# Patient Record
Sex: Male | Born: 1980 | Race: White | Hispanic: No | Marital: Married | Smoking: Current every day smoker
Health system: Southern US, Community
[De-identification: ages and names within clinical notes are randomized; demographics above are authoritative.]

## PROBLEM LIST (undated history)

## (undated) DIAGNOSIS — G44009 Cluster headache syndrome, unspecified, not intractable: Secondary | ICD-10-CM

## (undated) DIAGNOSIS — G43909 Migraine, unspecified, not intractable, without status migrainosus: Secondary | ICD-10-CM

## (undated) HISTORY — PX: WISDOM TOOTH EXTRACTION: SHX21

---

## 2009-01-06 ENCOUNTER — Ambulatory Visit: Payer: Self-pay | Admitting: Internal Medicine

## 2009-01-08 ENCOUNTER — Ambulatory Visit: Payer: Self-pay | Admitting: Internal Medicine

## 2009-01-10 ENCOUNTER — Ambulatory Visit: Payer: Self-pay | Admitting: Internal Medicine

## 2009-01-12 ENCOUNTER — Ambulatory Visit: Payer: Self-pay | Admitting: Internal Medicine

## 2009-01-16 ENCOUNTER — Ambulatory Visit: Payer: Self-pay | Admitting: Internal Medicine

## 2011-09-24 ENCOUNTER — Ambulatory Visit: Payer: Self-pay

## 2011-10-20 ENCOUNTER — Ambulatory Visit: Payer: Self-pay | Admitting: Family Medicine

## 2011-10-20 LAB — DOT URINE DIP
Glucose,UR: NEGATIVE mg/dL (ref 0–75)
Protein: NEGATIVE
Specific Gravity: 1.01 (ref 1.003–1.030)

## 2012-02-02 ENCOUNTER — Ambulatory Visit: Payer: Self-pay | Admitting: Internal Medicine

## 2012-02-04 LAB — BETA STREP CULTURE(ARMC)

## 2012-02-15 ENCOUNTER — Emergency Department: Payer: Self-pay | Admitting: Emergency Medicine

## 2012-02-19 LAB — WOUND CULTURE

## 2014-09-04 ENCOUNTER — Ambulatory Visit
Admit: 2014-09-04 | Disposition: A | Discharge status: Home / Self Care | Payer: Self-pay | Attending: Family Medicine | Admitting: Family Medicine

## 2014-09-05 ENCOUNTER — Emergency Department
Admit: 2014-09-05 | Disposition: A | Discharge status: Home / Self Care | Payer: Self-pay | Admitting: Emergency Medicine

## 2014-09-19 DIAGNOSIS — R51 Headache: Secondary | ICD-10-CM

## 2014-09-19 DIAGNOSIS — R519 Headache, unspecified: Secondary | ICD-10-CM | POA: Insufficient documentation

## 2014-09-19 DIAGNOSIS — G44011 Episodic cluster headache, intractable: Secondary | ICD-10-CM | POA: Insufficient documentation

## 2015-10-21 ENCOUNTER — Emergency Department: Payer: Self-pay

## 2015-10-21 ENCOUNTER — Emergency Department
Admission: EM | Admit: 2015-10-21 | Discharge: 2015-10-21 | Disposition: A | Discharge status: Home / Self Care | Payer: Self-pay | Attending: Emergency Medicine | Admitting: Emergency Medicine

## 2015-10-21 ENCOUNTER — Encounter: Payer: Self-pay | Admitting: Medical Oncology

## 2015-10-21 DIAGNOSIS — M5441 Lumbago with sciatica, right side: Secondary | ICD-10-CM | POA: Insufficient documentation

## 2015-10-21 DIAGNOSIS — G8929 Other chronic pain: Secondary | ICD-10-CM | POA: Insufficient documentation

## 2015-10-21 HISTORY — DX: Migraine, unspecified, not intractable, without status migrainosus: G43.909

## 2015-10-21 LAB — URINALYSIS COMPLETE WITH MICROSCOPIC (ARMC ONLY)
Bacteria, UA: NONE SEEN
Bilirubin Urine: NEGATIVE
Glucose, UA: NEGATIVE mg/dL
Hgb urine dipstick: NEGATIVE
Ketones, ur: NEGATIVE mg/dL
Leukocytes, UA: NEGATIVE
Nitrite: NEGATIVE
PROTEIN: NEGATIVE mg/dL
SQUAMOUS EPITHELIAL / LPF: NONE SEEN
Specific Gravity, Urine: 1.016 (ref 1.005–1.030)
pH: 6 (ref 5.0–8.0)

## 2015-10-21 MED ORDER — PREDNISONE 10 MG PO TABS: ORAL_TABLET | ORAL | Status: DC

## 2015-10-21 NOTE — ED Notes (Signed)
Pt reports lower back pain with radiation of pain into rt hip/leg x 1 week.

## 2015-10-21 NOTE — ED Notes (Signed)
Pt alert and oriented X4, active, cooperative, pt in NAD. RR even and unlabored, color WNL.  Pt informed to return if any life threatening symptoms occur.   

## 2015-10-21 NOTE — ED Provider Notes (Signed)
[facility] Emergency Department Provider Note   ____________________________________________  Time seen: Approximately 11:14 AM  I have reviewed the triage vital signs and the nursing notes.   HISTORY  Chief Complaint Back Pain   HPI Joe Guerra is a 35 y.o. male is here with complaint of back pain. Patient states that he has had back pain for "many years". He states that for the last 1-2 weeks that he has had increased back pain and has been doing a lot of lifting and bending at work which has probably made his back worse. Patient states that his pain radiating over into his right hip and leg last week. Patient states that he has not had his back x-rayed for approximately 8 years. He also denies any symptoms of UTIs and denies any previous history of kidney stones. He denies any incontinence of bowel or bladder. Currently he rates his pain as a 10 over 10. Patient also took a Percocet and wants someone else this morning for his back pain.   Past Medical History  Diagnosis Date  . Migraine     There are no active problems to display for this patient.   No past surgical history on file.  Current Outpatient Rx  Name  Route  Sig  Dispense  Refill  . predniSONE (DELTASONE) 10 MG tablet      Take 6 tablets  today, on day 2 take 5 tablets, day 3 take 4 tablets, day 4 take 3 tablets, day 5 take  2 tablets and 1 tablet the last day   21 tablet   0     Allergies Review of patient's allergies indicates no known allergies.  No family history on file.  Social History Social History  Substance Use Topics  . Smoking status: None  . Smokeless tobacco: None  . Alcohol Use: None    Review of Systems Constitutional: No fever/chills ENT: No sore throat. Cardiovascular: Denies chest pain. Respiratory: Denies shortness of breath. Gastrointestinal: No abdominal pain.  No nausea, no vomiting.   Genitourinary: Negative for  dysuria. Musculoskeletal: Positive for chronic and subacute low back pain. Skin: Negative for rash. Neurological: Negative for headaches, focal weakness or numbness. Positive radicular type pain over to right hip.  10-point ROS otherwise negative.  ____________________________________________   PHYSICAL EXAM:  VITAL SIGNS: ED Triage Vitals  Enc Vitals Group     BP 10/21/15 1045 106/74 mmHg     Pulse Rate 10/21/15 1045 75     Resp 10/21/15 1045 18     Temp 10/21/15 1045 97.7 F (36.5 C)     Temp Source 10/21/15 1045 Oral     SpO2 10/21/15 1045 99 %     Weight 10/21/15 1045 165 lb (74.844 kg)     Height 10/21/15 1045 6' (1.829 m)     Head Cir --      Peak Flow --      Pain Score 10/21/15 1046 10     Pain Loc --      Pain Edu? --      Excl. in GC? --     Constitutional: Alert and oriented. Well appearing and in no acute distress. Eyes: Conjunctivae are normal. PERRL. EOMI. Head: Atraumatic. Nose: No congestion/rhinnorhea. Neck: No stridor.   Cardiovascular: Normal rate, regular rhythm. Grossly normal heart sounds.  Good peripheral circulation. Respiratory: Normal respiratory effort.  No retractions. Lungs CTAB. Gastrointestinal: Soft and nontender. No distention.  Musculoskeletal: Examination of the lower back without  gross deformity. There is moderate tenderness on palpation of the L5-S1 paravertebral muscles to the right. No active muscle spasms are noted. Straight leg raises are approximately 90 with some discomfort in the right leg. Motor sensory function intact. Normal gait was noted. Neurologic:  Normal speech and language. No gross focal neurologic deficits are appreciated. No gait instability. Reflexes 1+ bilaterally. Skin:  Skin is warm, dry and intact. No rash noted. Psychiatric: Mood and affect are normal. Speech and behavior are normal.  ____________________________________________   LABS (all labs ordered are listed, but only abnormal results are  displayed)  Labs Reviewed  URINALYSIS COMPLETEWITH MICROSCOPIC ([facility] ONLY) - Abnormal; Notable for the following:    Color, Urine YELLOW (*)    APPearance CLEAR (*)    All other components within normal limits     RADIOLOGY  Lumbar spine x-ray per radiologist showed no acute abnormalities. ____________________________________________   PROCEDURES  Procedure(s) performed: None  Critical Care performed: No  ____________________________________________   INITIAL IMPRESSION / ASSESSMENT AND PLAN / ED COURSE  Pertinent labs & imaging results that were available during my care of the patient were reviewed by me and considered in my medical decision making (see chart for details).  Patient was given a prescription for prednisone 60 mg 6 day taper. Patient is follow-up with Dr. Joice LoftsPoggi if any continued problems with his back. Patient is also instructed to use ice or heat to his back as needed for pain and discomfort. ____________________________________________   FINAL CLINICAL IMPRESSION(S) / ED DIAGNOSES  Final diagnoses:  Chronic right-sided low back pain with right-sided sciatica      NEW MEDICATIONS STARTED DURING THIS VISIT:  Discharge Medication List as of 10/21/2015 12:19 PM    START taking these medications   Details  predniSONE (DELTASONE) 10 MG tablet Take 6 tablets  today, on day 2 take 5 tablets, day 3 take 4 tablets, day 4 take 3 tablets, day 5 take  2 tablets and 1 tablet the last day, Print         Note:  This document was prepared using Dragon voice recognition software and may include unintentional dictation errors.    Tommi Rumpshonda L Maridel Pixler, PA-C 10/21/15 1325

## 2015-10-21 NOTE — Discharge Instructions (Signed)
Begin taking prednisone 60 mg and taper for the next 6 days. Ice or heat to your lower back as needed for discomfort. Call and make an appointment with Dr. Joice LoftsPoggi for your back pain for further evaluation.

## 2015-10-21 NOTE — ED Notes (Signed)
States he has had lower back pain for several years off and on  But states pain has increased over the past 1-2 weeks  Denies any specific injury but does a lot of bending and lifting at work

## 2016-01-06 ENCOUNTER — Ambulatory Visit: Payer: Self-pay

## 2016-01-16 DIAGNOSIS — M545 Low back pain: Secondary | ICD-10-CM

## 2016-01-16 DIAGNOSIS — Z720 Tobacco use: Secondary | ICD-10-CM | POA: Insufficient documentation

## 2016-01-16 DIAGNOSIS — G8929 Other chronic pain: Secondary | ICD-10-CM | POA: Insufficient documentation

## 2016-01-31 ENCOUNTER — Ambulatory Visit: Payer: Self-pay | Attending: Neurology | Admitting: Physical Therapy

## 2016-01-31 DIAGNOSIS — G8929 Other chronic pain: Secondary | ICD-10-CM

## 2016-01-31 DIAGNOSIS — M5441 Lumbago with sciatica, right side: Secondary | ICD-10-CM | POA: Insufficient documentation

## 2016-01-31 DIAGNOSIS — R51 Headache: Secondary | ICD-10-CM | POA: Insufficient documentation

## 2016-01-31 DIAGNOSIS — M5442 Lumbago with sciatica, left side: Secondary | ICD-10-CM | POA: Insufficient documentation

## 2016-02-01 ENCOUNTER — Encounter: Payer: Self-pay | Admitting: Physical Therapy

## 2016-02-01 NOTE — Therapy (Addendum)
[facility] [address] [place], [place], [address] Phone: [phone]   Fax:  [phone]  Physical Therapy Evaluation  Patient Details  Name: Joe Guerra MRN: [id number] Date of Birth: 12/19/1980 No Data Recorded  Encounter Date: 01/31/2016      PT End of Session - 02/01/16 1504    Visit Number 1   Number of Visits 1   Date for PT Re-Evaluation 02/01/16   PT Start Time 0808   PT Stop Time 0939   PT Time Calculation (min) 91 min   Activity Tolerance Patient limited by pain      Past Medical History:  Diagnosis Date  . Migraine     History reviewed. No pertinent surgical history.  There were no vitals filed for this visit.       Subjective Assessment - 02/01/16 1504    Subjective See FCE   Currently in Pain? Yes      Overall Level of Work: Falls within the [employer] range.  Exerting up to 20 pounds of force occasionally, and/or up to 10 pounds of force frequently, and/or a negligible amount of force constantly (Constantly: activity or condition exist 2/3 or more of the time) to move objects.  Physical demand requirements are in excess of those for Sedentary Work.  Even though the weight lifted may be only a negligible amount, a job should be rated Light Work: (1) when it requires walking or standing to significant degree; or (2) when it requires sitting most of the time but entails pushing and/or pulling of arm or leg controls; and/or (3) when the job requires working at a production rate pace entailing the constant pushing and/or pulling of materials even though the weight of those materials is negligible.  NOTE: The constant stress and strain of maintaining a production rate pace, especially in an industrial setting, can be and is physically demanding of a worker even though the amount of force exerted is negligible.  Please note that the dynamic strength/manual materials handling section of the report indicates a  higher level of work than that determined by considering the client's performance on the entire test.  Our research shows that the safe, overall level of work is significantly influenced by non-materials handling (i.e. position tolerance and mobility) abilities.  To ignore these non-materials handling demands, negatively impacts the validity of the test.    Please see the Task Performance Table for specific abilities.   Tolerance for the 8-Hour Day: Based on the individual task scores in Dynamic Strength, Position Tolerance and Mobility, the client is able to tolerate the Light level of work for the 8-hour day/40-hour week.          Plan - 02/01/16 1505    Clinical Impression Statement See FCE   PT Frequency 1x / week   PT Treatment/Interventions Patient/family education;Functional mobility training      Patient will benefit from skilled therapeutic intervention in order to improve the following deficits and impairments:  Decreased activity tolerance, Pain  Visit Diagnosis: Bilateral low back pain with sciatica, sciatica laterality unspecified  Chronic nonintractable headache, unspecified headache type     Problem List There are no active problems to display for this patient.  Cammie McgeeMichael C Shawna Wearing, PT, DPT # [phone]  02/01/2016, 3:07 PM  [facility] [address] [place], [place], [address] Phone: [phone]   Fax:  [phone]  Name: Joe Guerra MRN: [id number] Date of Birth: 12/14/1980

## 2016-02-03 ENCOUNTER — Encounter: Payer: Self-pay | Admitting: Physical Therapy

## 2016-02-03 NOTE — Addendum Note (Signed)
Addended by: Cammie McgeeSHERK, Darly Fails C on: 02/03/2016 09:46 AM   Modules accepted: Orders

## 2016-06-26 ENCOUNTER — Encounter: Payer: Self-pay | Admitting: Emergency Medicine

## 2016-06-26 ENCOUNTER — Emergency Department: Payer: Self-pay

## 2016-06-26 ENCOUNTER — Emergency Department
Admission: EM | Admit: 2016-06-26 | Discharge: 2016-06-26 | Disposition: A | Discharge status: Home / Self Care | Payer: Self-pay | Attending: Emergency Medicine | Admitting: Emergency Medicine

## 2016-06-26 DIAGNOSIS — F17210 Nicotine dependence, cigarettes, uncomplicated: Secondary | ICD-10-CM | POA: Insufficient documentation

## 2016-06-26 DIAGNOSIS — N4340 Spermatocele of epididymis, unspecified: Secondary | ICD-10-CM | POA: Insufficient documentation

## 2016-06-26 DIAGNOSIS — N50819 Testicular pain, unspecified: Secondary | ICD-10-CM

## 2016-06-26 MED ORDER — TRAMADOL HCL 50 MG PO TABS: 50.0000 mg | ORAL_TABLET | Freq: Four times a day (QID) | ORAL | 0 refills | Status: AC | PRN

## 2016-06-26 NOTE — ED Provider Notes (Signed)
[facility] Emergency Department Provider Note   ____________________________________________   I have reviewed the triage vital signs and the nursing notes.   HISTORY  Chief Complaint Testicle Pain   History limited by: Not Limited   HPI Joe Guerra is a 36 y.o. male who presents to the emergency department today because of left testicular pain. The pain started last night. States it feels like something is thumping his testicle. Denies any trauma. States the pain is worse with touch. The patient has not had any pain or discomfort with urination. Denies any fevers, nausea or vomiting.    Past Medical History:  Diagnosis Date  . Migraine     There are no active problems to display for this patient.   History reviewed. No pertinent surgical history.  Prior to Admission medications   Medication Sig Start Date End Date Taking? Authorizing Provider  predniSONE (DELTASONE) 10 MG tablet Take 6 tablets  today, on day 2 take 5 tablets, day 3 take 4 tablets, day 4 take 3 tablets, day 5 take  2 tablets and 1 tablet the last day 10/21/15   Tommi Rumpshonda L Summers, PA-C    Allergies Patient has no known allergies.  No family history on file.  Social History Social History  Substance Use Topics  . Smoking status: Current Every Day Smoker    Packs/day: 0.50    Types: Cigarettes  . Smokeless tobacco: Never Used  . Alcohol use No    Review of Systems  Constitutional: Negative for fever. Cardiovascular: Negative for chest pain. Respiratory: Negative for shortness of breath. Gastrointestinal: Negative for abdominal pain, vomiting and diarrhea.  Genitourinary: Negative for dysuria. Positive for left testicle pain. Musculoskeletal: Negative for back pain. Skin: Negative for rash. Neurological: Negative for headaches, focal weakness or numbness.  10-point ROS otherwise negative.  ____________________________________________   PHYSICAL  EXAM:  VITAL SIGNS: ED Triage Vitals [06/26/16 1427]  Enc Vitals Group     BP (!) 139/92     Pulse Rate 93     Resp 18     Temp 97.7 F (36.5 C)     Temp Source Oral     SpO2 100 %     Weight 165 lb (74.8 kg)     Height 6' (1.829 m)     Head Circumference     Constitutional: Alert and oriented. Well appearing and in no distress. Eyes: Conjunctivae are normal. Normal extraocular movements. ENT   Head: Normocephalic and atraumatic.   Nose: No congestion/rhinnorhea.   Mouth/Throat: Mucous membranes are moist.   Neck: No stridor. Hematological/Lymphatic/Immunilogical: No cervical lymphadenopathy. Cardiovascular: Normal rate, regular rhythm.  No murmurs, rubs, or gallops.  Respiratory: Normal respiratory effort without tachypnea nor retractions. Breath sounds are clear and equal bilaterally. No wheezes/rales/rhonchi. Gastrointestinal: Soft and non tender. No rebound. No guarding.  Genitourinary: Bilateral descended testis. Left testicle with tenderness, no swelling, no erythema to overlying skin, no warmth.  Musculoskeletal: Normal range of motion in all extremities. No lower extremity edema. Neurologic:  Normal speech and language. No gross focal neurologic deficits are appreciated.  Skin:  Skin is warm, dry and intact. No rash noted. Psychiatric: Mood and affect are normal. Speech and behavior are normal. Patient exhibits appropriate insight and judgment.  ____________________________________________    LABS (pertinent positives/negatives)  None  ____________________________________________   EKG  None  ____________________________________________    RADIOLOGY  US scrotum IMPRESSION: 1. Large epididymal head cysts versus spermatoceles on the left, tender to palpation. 2.  No testicular abnormality.  ____________________________________________   PROCEDURES  Procedures  ____________________________________________   INITIAL IMPRESSION /  ASSESSMENT AND PLAN / ED COURSE  Pertinent labs & imaging results that were available during my care of the patient were reviewed by me and considered in my medical decision making (see chart for details).  Patient presented because of concern for left testicle pain. US shows epididymal head cysts versus spermatoceles. At this point think likely those are the cause of the patient's pain. Will give urology follow up information and pain medication.   ____________________________________________   FINAL CLINICAL IMPRESSION(S) / ED DIAGNOSES  Final diagnoses:  Testicular pain     Note: This dictation was prepared with Dragon dictation. Any transcriptional errors that result from this process are unintentional     Phineas Semen, MD 06/26/16 1609

## 2016-06-26 NOTE — ED Triage Notes (Signed)
Pt complains of pain to left testicle that started last night and states it is sore to touch, hurts when walking or bending.

## 2016-06-26 NOTE — Discharge Instructions (Signed)
Please seek medical attention for any high fevers, chest pain, shortness of breath, change in behavior, persistent vomiting, bloody stool or any other new or concerning symptoms.  

## 2016-07-13 ENCOUNTER — Encounter: Payer: Self-pay | Admitting: Urology

## 2016-07-13 ENCOUNTER — Ambulatory Visit (INDEPENDENT_AMBULATORY_CARE_PROVIDER_SITE_OTHER): Payer: Self-pay | Admitting: Urology

## 2016-07-13 VITALS — BP 153/88 | HR 93 | Ht 72.0 in | Wt 172.1 lb

## 2016-07-13 DIAGNOSIS — R3129 Other microscopic hematuria: Secondary | ICD-10-CM

## 2016-07-13 DIAGNOSIS — N4340 Spermatocele of epididymis, unspecified: Secondary | ICD-10-CM

## 2016-07-13 DIAGNOSIS — N50819 Testicular pain, unspecified: Secondary | ICD-10-CM

## 2016-07-13 DIAGNOSIS — R399 Unspecified symptoms and signs involving the genitourinary system: Secondary | ICD-10-CM

## 2016-07-13 LAB — URINALYSIS, COMPLETE
BILIRUBIN UA: NEGATIVE
Glucose, UA: NEGATIVE
KETONES UA: NEGATIVE
Leukocytes, UA: NEGATIVE
NITRITE UA: NEGATIVE
Protein, UA: NEGATIVE
SPEC GRAV UA: 1.02 (ref 1.005–1.030)
UUROB: 0.2 mg/dL (ref 0.2–1.0)
pH, UA: 5.5 (ref 5.0–7.5)

## 2016-07-13 LAB — MICROSCOPIC EXAMINATION: Epithelial Cells (non renal): NONE SEEN /hpf (ref 0–10)

## 2016-07-13 LAB — BLADDER SCAN AMB NON-IMAGING: SCAN RESULT: 20

## 2016-07-13 MED ORDER — IBUPROFEN 400 MG PO TABS: 400.0000 mg | ORAL_TABLET | Freq: Four times a day (QID) | ORAL | 0 refills | Status: DC | PRN

## 2016-07-13 NOTE — Progress Notes (Signed)
07/13/2016 12:49 PM   Joe Guerra 10-Feb-1981 [id number]  Referring provider: Morene Crocker, MD [phone] [facility] [address] [facility] [place], [place] [address]  Chief Complaint  Patient presents with  . Testicle Pain    HPI: Patient is a 36 year old Caucasian male who is referred to [place] by [facility] ED for left testicular pain who presents with his mother, Gunnar Fusi.    He states that the pain started two weeks ago.  It started in the evening while he was cooking and became sore.  The pain increased throughout the evening and it became unbearable.  It was 10/10 pain.  He did not have fevers, chills, nausea or vomiting.  He did have a weak urinary stream with the pain.  He did not notice any difference with his testicles with the pain.  He has not had gross hematuria, dysuria or suprapubic pain.   He denies any injuries to the scrotal area.  He was also experiencing frequency, urgency and intermittency.   I PSS score is 20/4.  His PVR 20 mL.    He is sexually active with his wife only.    He was seen in ED where the scrotal ultrasound was performed and he was given Tramadol for pain.    The pain has improved and he was going to cancel the appointment, but then the pain changed from left testicular pain radiating up to the left groin over to the right testicle.  The pain is now a 3/10.    Scrotal ultrasound performed on 06/26/2016 noted a large epididymal head cysts versus spermatoceles on the left, tender to palpation.  No testicular abnormality.  I have independently reviewed the films.    He has also been experiencing premature ejaculation for the last several months.    He has a history of right sciatic for which he sees the chiropractor when he can afford it.       IPSS    Row Name 07/13/16 1100         International Prostate Symptom Score   How often have you had the sensation of not emptying your bladder? Not at All     How often have you had to  urinate less than every two hours? About half the time     How often have you found you stopped and started again several times when you urinated? Almost always     How often have you found it difficult to postpone urination? More than half the time     How often have you had a weak urinary stream? Almost always     How often have you had to strain to start urination? About half the time     How many times did you typically get up at night to urinate? None     Total IPSS Score 20       Quality of Life due to urinary symptoms   If you were to spend the rest of your life with your urinary condition just the way it is now how would you feel about that? Mostly Disatisfied        Score:  1-7 Mild 8-19 Moderate 20-35 Severe  PMH: Past Medical History:  Diagnosis Date  . Migraine     Surgical History: Past Surgical History:  Procedure Laterality Date  . WISDOM TOOTH EXTRACTION      Home Medications:  Allergies as of 07/13/2016   No Known Allergies  Medication List       Accurate as of 07/13/16 12:49 PM. Always use your most recent med list.          ibuprofen 400 MG tablet Commonly known as:  ADVIL,MOTRIN Take 1 tablet (400 mg total) by mouth every 6 (six) hours as needed.   nortriptyline 50 MG capsule Commonly known as:  PAMELOR Take by mouth.   predniSONE 10 MG tablet Commonly known as:  DELTASONE Take 6 tablets  today, on day 2 take 5 tablets, day 3 take 4 tablets, day 4 take 3 tablets, day 5 take  2 tablets and 1 tablet the last day   topiramate 100 MG tablet Commonly known as:  TOPAMAX Take by mouth.   traMADol 50 MG tablet Commonly known as:  ULTRAM Take 1 tablet (50 mg total) by mouth every 6 (six) hours as needed.   verapamil 80 MG tablet Commonly known as:  CALAN Take by mouth.       Allergies: No Known Allergies  Family History: Family History  Problem Relation Age of Onset  . Heart disease Father   . Thyroid cancer Mother   . Prostate  cancer Paternal Grandfather   . Bladder Cancer Neg Hx   . Kidney cancer Neg Hx     Social History:  reports that he has been smoking Cigarettes.  He has been smoking about 0.50 packs per day. He has never used smokeless tobacco. He reports that he does not drink alcohol or use drugs.  ROS: UROLOGY Frequent Urination?: Yes Hard to postpone urination?: Yes Burning/pain with urination?: No Get up at night to urinate?: No Leakage of urine?: Yes Urine stream starts and stops?: Yes Trouble starting stream?: No Do you have to strain to urinate?: No Blood in urine?: No Urinary tract infection?: No Sexually transmitted disease?: No Injury to kidneys or bladder?: No Painful intercourse?: No Weak stream?: Yes Erection problems?: No Penile pain?: No  Gastrointestinal Nausea?: No Vomiting?: No Indigestion/heartburn?: Yes Diarrhea?: No Constipation?: Yes  Constitutional Fever: No Night sweats?: Yes Weight loss?: No Fatigue?: No  Skin Skin rash/lesions?: No Itching?: No  Eyes Blurred vision?: No Double vision?: No  Ears/Nose/Throat Sore throat?: No Sinus problems?: Yes  Hematologic/Lymphatic Swollen glands?: No Easy bruising?: No  Cardiovascular Leg swelling?: No Chest pain?: No  Respiratory Cough?: No Shortness of breath?: No  Endocrine Excessive thirst?: No  Musculoskeletal Back pain?: Yes Joint pain?: Yes  Neurological Headaches?: Yes Dizziness?: No  Psychologic Depression?: Yes Anxiety?: Yes  Physical Exam: BP (!) 153/88 (BP Location: Left Arm, Patient Position: Sitting, Cuff Size: Normal)   Pulse 93   Ht 6' (1.829 m)   Wt 172 lb 1.6 oz (78.1 kg)   BMI 23.34 kg/m   Constitutional: Well nourished. Alert and oriented, No acute distress. HEENT: [place] AT, moist mucus membranes. Trachea midline, no masses. Cardiovascular: No clubbing, cyanosis, or edema. Respiratory: Normal respiratory effort, no increased work of breathing. GI: Abdomen is soft,  non tender, non distended, no abdominal masses. Liver and spleen not palpable.  No hernias appreciated.  Stool sample for occult testing is not indicated.   GU: No CVA tenderness.  No bladder fullness or masses.  Patient with circumcised phallus.  Urethral meatus is patent.  No penile discharge. No penile lesions or rashes. Scrotum without lesions, cysts, rashes and/or edema.  Testicles are located scrotally bilaterally. No masses are appreciated in the testicles. Left and right epididymis are normal.  Left spermatocele is tender to palpation.  Rectal: Patient with increased sphincter tone. Anus and perineum without scarring or rashes. No rectal masses are appreciated. Prostate is approximately 45 grams, no nodules are appreciated. Seminal vesicles are normal. Skin: No rashes, bruises or suspicious lesions. Lymph: No cervical or inguinal adenopathy. Neurologic: Grossly intact, no focal deficits, moving all 4 extremities. Psychiatric: Normal mood and affect.  Laboratory Data: Urinalysis 0-10 RBC's.  See EPIC.    Pertinent Imaging: CLINICAL DATA:  Testicular pain  EXAM: SCROTAL ULTRASOUND  DOPPLER ULTRASOUND OF THE TESTICLES  TECHNIQUE: Complete ultrasound examination of the testicles, epididymis, and other scrotal structures was performed. Color and spectral Doppler ultrasound were also utilized to evaluate blood flow to the testicles.  COMPARISON:  None.  FINDINGS: Right testicle  Measurements: 5.4 x 2.3 x 2.9 cm. No mass or microlithiasis visualized.  Left testicle  Measurements: 5.5 x 2.0 x 3.3 cm. No mass or microlithiasis visualized.  Right epididymis:  Normal in size and appearance.  Left epididymis: There are 2 large hypoechoic structures at the left epididymal head. The first measures approximately 2.2 x 1.2 x 2.3 cm. The second measures 1.2 x 0.9 x 1.3 cm and contains mildly echogenic debris.  Hydrocele:  Small bilateral hydroceles.  Varicocele:   None.  Pulsed Doppler interrogation of both testes demonstrates normal low resistance arterial and venous waveforms bilaterally.  IMPRESSION: 1. Large epididymal head cysts versus spermatoceles on the left, tender to palpation. 2. No testicular abnormality.   Electronically Signed   By: Deatra Robinson M.D.   On: 06/26/2016 15:35  Results for Morua, JONES VIVIANI (MRN [id number]) as of 07/13/2016 11:14  Ref. Range 07/13/2016 10:43  Scan Result Unknown 20    Assessment & Plan:    1. Left spermatocele  - a trial of NSAIDS for 6 weeks  - patient is instructed to contact the office if pain worsens or testicles/scrotum swells  - Advised to contact our office or seek treatment in the ED if becomes febrile or pain/ vomiting are difficult control in order to arrange for emergent/urgent intervention  2. LU TS  - I PSS score 20/4  - sphincter tone was increased on exam - pelvic floor dysfunction may be the cause of his urinary symptoms - patient without insurance so unable to afford PT  - will reassess once hematuria work up is completed  3. Microscopic hematuria  - I explained to the patient that there are a number of causes that can be associated with blood in the urine, such as stones,  BPH, UTI's, damage to the urinary tract and/or cancer.  - At this time, I felt that the patient warranted further urologic evaluation.   The AUA guidelines state that a CT urogram is the preferred imaging study to evaluate hematuria.  - I explained to the patient that a contrast material will be injected into a vein and that in rare instances, an allergic reaction can result and may even life threatening   The patient denies any allergies to contrast, iodine and/or seafood and is not taking metformin.  - Following the imaging study,  I've recommended a cystoscopy. I described how this is performed, typically in an office setting with a flexible cystoscope. We described the risks, benefits, and  possible side effects, the most common of which is a minor amount of blood in the urine and/or burning which usually resolves in 24 to 48 hours.    - The patient had the opportunity to ask questions which were answered. Based upon this discussion,  the patient is willing to proceed. Therefore, I've ordered: a CT Urogram and cystoscopy.  - The patient will return following all of the above for discussion of the results.   - UA  - Urine culture  - BUN + creatinine     Return for CT Urogram report and cystoscopy.  These notes generated with voice recognition software. I apologize for typographical errors.  Michiel Cowboy, PA-C  [facility] [address] [place], [place] [address] [phone]

## 2016-07-14 LAB — BUN+CREAT
BUN / CREAT RATIO: 15 (ref 9–20)
BUN: 14 mg/dL (ref 6–20)
CREATININE: 0.95 mg/dL (ref 0.76–1.27)
GFR calc Af Amer: 118 mL/min/{1.73_m2} (ref >59)
GFR, EST NON AFRICAN AMERICAN: 102 mL/min/{1.73_m2} (ref >59)

## 2016-07-16 LAB — CULTURE, URINE COMPREHENSIVE

## 2016-07-24 ENCOUNTER — Ambulatory Visit
Admission: RE | Admit: 2016-07-24 | Discharge: 2016-07-24 | Disposition: A | Discharge status: Home / Self Care | Payer: Self-pay | Source: Ambulatory Visit | Attending: Urology | Admitting: Urology

## 2016-07-24 DIAGNOSIS — R3129 Other microscopic hematuria: Secondary | ICD-10-CM | POA: Insufficient documentation

## 2016-07-24 MED ORDER — IOPAMIDOL (ISOVUE-300) INJECTION 61%
150.0000 mL | Freq: Once | INTRAVENOUS | Status: AC | PRN
  Administered 2016-07-24: 150 mL via INTRAVENOUS

## 2016-07-30 ENCOUNTER — Encounter: Payer: Self-pay | Admitting: Urology

## 2016-07-30 ENCOUNTER — Ambulatory Visit: Payer: Self-pay | Admitting: Urology

## 2016-07-30 VITALS — BP 155/90 | HR 116 | Ht 72.0 in | Wt 181.3 lb

## 2016-07-30 DIAGNOSIS — R3129 Other microscopic hematuria: Secondary | ICD-10-CM

## 2016-07-30 LAB — MICROSCOPIC EXAMINATION
Bacteria, UA: NONE SEEN
RBC MICROSCOPIC, UA: NONE SEEN /HPF (ref 0–?)
WBC, UA: NONE SEEN /hpf (ref 0–?)

## 2016-07-30 LAB — URINALYSIS, COMPLETE
Bilirubin, UA: NEGATIVE
GLUCOSE, UA: NEGATIVE
KETONES UA: NEGATIVE
Leukocytes, UA: NEGATIVE
NITRITE UA: NEGATIVE
PROTEIN UA: NEGATIVE
RBC, UA: NEGATIVE
SPEC GRAV UA: 1.01 (ref 1.005–1.030)
Urobilinogen, Ur: 0.2 mg/dL (ref 0.2–1.0)
pH, UA: 5.5 (ref 5.0–7.5)

## 2016-07-30 MED ORDER — LIDOCAINE HCL 2 % EX GEL
1.0000 "application " | Freq: Once | CUTANEOUS | Status: AC
  Administered 2016-07-30: 1 via URETHRAL

## 2016-07-30 MED ORDER — CIPROFLOXACIN HCL 500 MG PO TABS
500.0000 mg | ORAL_TABLET | Freq: Once | ORAL | Status: AC
  Administered 2016-07-30: 500 mg via ORAL

## 2016-07-30 NOTE — Progress Notes (Signed)
   07/30/16  CC:  Chief Complaint  Patient presents with  . Cysto    hematuria     HPI: The patient is a 36 year old gentleman presents today for completion of a microscopic hematuria workup. CT urogram was unremarkable for sources of hematuria. Patient does smoke.  At his last visit, he is also diagnosed with a spermatocele as well as lower urinary tract symptoms likely secondary to increased sphincter tone as assessed on physical exam. His urinary symptoms have resolved. He also had testicular pain which is no longer present.  There were no vitals taken for this visit. NED. A&Ox3.   No respiratory distress   Abd soft, NT, ND Normal phallus with bilateral descended testicles  Cystoscopy Procedure Note  Patient identification was confirmed, informed consent was obtained, and patient was prepped using Betadine solution.  Lidocaine jelly was administered per urethral meatus.    Preoperative abx where received prior to procedure.     Pre-Procedure: - Inspection reveals a normal caliber ureteral meatus.  Procedure: The flexible cystoscope was introduced without difficulty - No urethral strictures/lesions are present. - Normal prostate  - Normal bladder neck - Bilateral ureteral orifices identified - Bladder mucosa  reveals no ulcers, tumors, or lesions - No bladder stones - No trabeculation  Retroflexion shows no intravesical lobe   Post-Procedure: - Patient tolerated the procedure well  Assessment/ Plan:  1. Microscopic hematuria -Negative work up. Repeat urinalysis in one year

## 2017-05-19 ENCOUNTER — Telehealth: Payer: Self-pay | Admitting: Pharmacy Technician

## 2017-05-19 NOTE — Telephone Encounter (Signed)
Patient failed to provide current poi.  No additional medication assistance will be provided by [facility] without the required proof of income documentation.  Patient notified by letter.  Edwin Baines J. Tramaine Snell Care Manager [facility] 

## 2017-07-29 ENCOUNTER — Ambulatory Visit: Payer: Self-pay

## 2018-05-14 ENCOUNTER — Ambulatory Visit
Admission: EM | Admit: 2018-05-14 | Discharge: 2018-05-14 | Disposition: A | Discharge status: Home / Self Care | Payer: BLUE CROSS/BLUE SHIELD | Attending: Family Medicine | Admitting: Family Medicine

## 2018-05-14 ENCOUNTER — Other Ambulatory Visit: Payer: Self-pay

## 2018-05-14 DIAGNOSIS — G44019 Episodic cluster headache, not intractable: Secondary | ICD-10-CM | POA: Diagnosis not present

## 2018-05-14 MED ORDER — ONDANSETRON 4 MG PO TBDP: 4.0000 mg | ORAL_TABLET | Freq: Three times a day (TID) | ORAL | 0 refills | Status: DC | PRN

## 2018-05-14 MED ORDER — SUMATRIPTAN SUCCINATE 100 MG PO TABS: ORAL_TABLET | ORAL | 1 refills | Status: DC

## 2018-05-14 MED ORDER — SUMATRIPTAN SUCCINATE 6 MG/0.5ML ~~LOC~~ SOLN
6.0000 mg | Freq: Once | SUBCUTANEOUS | Status: AC
  Administered 2018-05-14: 6 mg via SUBCUTANEOUS

## 2018-05-14 NOTE — ED Provider Notes (Signed)
[facility]    CSN: [id number] Arrival date & time: 05/14/18  1441  History   Chief Complaint Chief Complaint  Patient presents with  . Headache   HPI  37 year old male with history of cluster headaches presents with frequent headache.  Patient previously followed by neurology and notes with episodic cluster headaches.  Patient states that he not been on any medication for quite some time.  He states that he could not afford it.  Patient states that for the past 3 weeks he has had daily headaches.  Come in bursts.  Severe.  He reports associated photophobia and phonophobia.  Nausea as well.  He states that he is having a mild headache currently.  He has had a beneficial response to Imitrex in the past.  No reports of fever.  No other associated symptoms.  No other complaints.  PMH, Surgical Hx, Family Hx, Social History reviewed and updated as below.  PMH: Patient Active Problem List   Diagnosis Date Noted  . Chronic bilateral low back pain without sciatica 01/16/2016  . Tobacco abuse 01/16/2016  . Headache 09/19/2014  . Intractable episodic cluster headache 09/19/2014   Past Surgical History:  Procedure Laterality Date  . WISDOM TOOTH EXTRACTION     Home Medications    Prior to Admission medications   Medication Sig Start Date End Date Taking? Authorizing Provider  ondansetron (ZOFRAN-ODT) 4 MG disintegrating tablet Take 1 tablet (4 mg total) by mouth every 8 (eight) hours as needed for nausea or vomiting. 05/14/18   Tommie Samsook, Alayssa Flinchum G, DO  SUMAtriptan (IMITREX) 100 MG tablet 1 tablet at the onset of headache. May repeat dose in 2 hours. 05/14/18   Tommie Samsook, Berdena Cisek G, DO    Family History Family History  Problem Relation Age of Onset  . Heart disease Father   . Thyroid cancer Mother   . Prostate cancer Paternal Grandfather   . Bladder Cancer Neg Hx   . Kidney cancer Neg Hx     Social History Social History   Tobacco Use  . Smoking status: Current Every Day  Smoker    Packs/day: 0.50    Types: Cigarettes  . Smokeless tobacco: Never Used  Substance Use Topics  . Alcohol use: No  . Drug use: No     Allergies   Influenza vaccines   Review of Systems Review of Systems  Constitutional: Negative for fever.  Eyes: Positive for photophobia.  Gastrointestinal: Positive for nausea.  Neurological: Positive for headaches.   Physical Exam Triage Vital Signs ED Triage Vitals  Enc Vitals Group     BP 05/14/18 1510 (!) 139/94     Pulse Rate 05/14/18 1510 66     Resp 05/14/18 1510 16     Temp 05/14/18 1510 97.6 F (36.4 C)     Temp Source 05/14/18 1510 Oral     SpO2 05/14/18 1510 100 %     Weight 05/14/18 1511 180 lb (81.6 kg)     Height 05/14/18 1511 6' (1.829 m)     Head Circumference --      Peak Flow --      Pain Score 05/14/18 1511 10     Pain Loc --      Pain Edu? --      Excl. in GC? --    Updated Vital Signs BP (!) 139/94 (BP Location: Left Arm)   Pulse 66   Temp 97.6 F (36.4 C) (Oral)   Resp 16   Ht 6' (1.829  m)   Wt 81.6 kg   SpO2 100%   BMI 24.41 kg/m   Visual Acuity Right Eye Distance:   Left Eye Distance:   Bilateral Distance:    Right Eye Near:   Left Eye Near:    Bilateral Near:     Physical Exam Vitals signs and nursing note reviewed.  Constitutional:      General: He is not in acute distress. HENT:     Head: Normocephalic and atraumatic.  Eyes:     Conjunctiva/sclera: Conjunctivae normal.     Pupils: Pupils are equal, round, and reactive to light.  Cardiovascular:     Rate and Rhythm: Normal rate and regular rhythm.  Pulmonary:     Effort: Pulmonary effort is normal. No respiratory distress.     Breath sounds: Normal breath sounds.  Neurological:     General: No focal deficit present.     Mental Status: He is alert and oriented to person, place, and time.  Psychiatric:        Mood and Affect: Mood normal.        Behavior: Behavior normal.    UC Treatments / Results  Labs (all labs  ordered are listed, but only abnormal results are displayed) Labs Reviewed - No data to display  EKG None  Radiology No results found.  Procedures Procedures (including critical care time)  Medications Ordered in UC Medications  SUMAtriptan (IMITREX) injection 6 mg (6 mg Subcutaneous Given 05/14/18 1548)    Initial Impression / Assessment and Plan / UC Course  I have reviewed the triage vital signs and the nursing notes.  Pertinent labs & imaging results that were available during my care of the patient were reviewed by me and considered in my medical decision making (see chart for details).    37 year old male presents with episodic cluster headaches.  Patient given Imitrex here today.  Sending home on Imitrex and Zofran.  Advised that he may need prophylactic medication.  Patient does not want this at this time.  Final Clinical Impressions(s) / UC Diagnoses   Final diagnoses:  Episodic cluster headache, not intractable   Discharge Instructions   None    ED Prescriptions    Medication Sig Dispense Auth. Provider   SUMAtriptan (IMITREX) 100 MG tablet 1 tablet at the onset of headache. May repeat dose in 2 hours. 20 tablet [place] Huntsman G, DO   ondansetron (ZOFRAN-ODT) 4 MG disintegrating tablet Take 1 tablet (4 mg total) by mouth every 8 (eight) hours as needed for nausea or vomiting. 20 tablet Tommie Samsook, Eneida Evers G, DO     Controlled Substance Prescriptions [place] Controlled Substance Registry consulted? Not Applicable   Tommie SamsCook, Trygve Thal G, DO 05/14/18 1706

## 2018-05-14 NOTE — ED Triage Notes (Addendum)
Pt with 3 weeks of headache pain. Hx of cluster headaches and this feels the same. Pain 10/10. Pain is behind right eye

## 2018-05-17 ENCOUNTER — Encounter: Payer: Self-pay | Admitting: Emergency Medicine

## 2018-05-17 ENCOUNTER — Ambulatory Visit
Admission: EM | Admit: 2018-05-17 | Discharge: 2018-05-17 | Disposition: A | Discharge status: Home / Self Care | Payer: BLUE CROSS/BLUE SHIELD | Attending: Family Medicine | Admitting: Family Medicine

## 2018-05-17 ENCOUNTER — Other Ambulatory Visit: Payer: Self-pay

## 2018-05-17 DIAGNOSIS — G44009 Cluster headache syndrome, unspecified, not intractable: Secondary | ICD-10-CM | POA: Insufficient documentation

## 2018-05-17 MED ORDER — KETOROLAC TROMETHAMINE 10 MG PO TABS: 10.0000 mg | ORAL_TABLET | Freq: Three times a day (TID) | ORAL | 0 refills | Status: DC | PRN

## 2018-05-17 MED ORDER — KETOROLAC TROMETHAMINE 60 MG/2ML IM SOLN
60.0000 mg | Freq: Once | INTRAMUSCULAR | Status: AC
  Administered 2018-05-17: 60 mg via INTRAMUSCULAR

## 2018-05-17 MED ORDER — CYCLOBENZAPRINE HCL 10 MG PO TABS: 10.0000 mg | ORAL_TABLET | Freq: Three times a day (TID) | ORAL | 0 refills | Status: DC | PRN

## 2018-05-17 NOTE — ED Triage Notes (Signed)
Patient reports history of chronic cluster HAs.  Patient states that he was seen on Saturday for the same kind of HA.  Patient states this HA started around 2am this morning.

## 2018-05-17 NOTE — ED Provider Notes (Signed)
[facility]    CSN: [id number] Arrival date & time: 05/17/18  1216     History   Chief Complaint Chief Complaint  Patient presents with  . Headache    HPI Joe Guerra is a 37 y.o. male.   37 yo male with a c/o headache since this morning. States he has a h/o cluster headaches and was seen here 3 days ago with the same. States usually imitrex helps. Denies worst headache ever. Denies any fevers, chills, nausea, vomiting. Complains of muscle tightness to his upper back and neck.   The history is provided by the patient.  Headache    Past Medical History:  Diagnosis Date  . Migraine     Patient Active Problem List   Diagnosis Date Noted  . Chronic bilateral low back pain without sciatica 01/16/2016  . Tobacco abuse 01/16/2016  . Headache 09/19/2014  . Intractable episodic cluster headache 09/19/2014    Past Surgical History:  Procedure Laterality Date  . WISDOM TOOTH EXTRACTION         Home Medications    Prior to Admission medications   Medication Sig Start Date End Date Taking? Authorizing Provider  SUMAtriptan (IMITREX) 100 MG tablet 1 tablet at the onset of headache. May repeat dose in 2 hours. 05/14/18  Yes Cook, Jayce G, DO  cyclobenzaprine (FLEXERIL) 10 MG tablet Take 1 tablet (10 mg total) by mouth 3 (three) times daily as needed for muscle spasms. 05/17/18   Payton Mccallumonty, Anae Hams, MD  ketorolac (TORADOL) 10 MG tablet Take 1 tablet (10 mg total) by mouth every 8 (eight) hours as needed. 05/17/18   Payton Mccallumonty, Huxton Glaus, MD  ondansetron (ZOFRAN-ODT) 4 MG disintegrating tablet Take 1 tablet (4 mg total) by mouth every 8 (eight) hours as needed for nausea or vomiting. 05/14/18   Tommie Samsook, Jayce G, DO    Family History Family History  Problem Relation Age of Onset  . Heart disease Father   . Thyroid cancer Mother   . Prostate cancer Paternal Grandfather   . Bladder Cancer Neg Hx   . Kidney cancer Neg Hx     Social History Social History    Tobacco Use  . Smoking status: Current Every Day Smoker    Packs/day: 0.50    Types: Cigarettes  . Smokeless tobacco: Never Used  Substance Use Topics  . Alcohol use: No  . Drug use: No     Allergies   Influenza vaccines   Review of Systems Review of Systems  Neurological: Positive for headaches.     Physical Exam Triage Vital Signs ED Triage Vitals  Enc Vitals Group     BP 05/17/18 1225 (!) 135/96     Pulse Rate 05/17/18 1225 79     Resp 05/17/18 1225 16     Temp 05/17/18 1225 97.6 F (36.4 C)     Temp Source 05/17/18 1225 Oral     SpO2 05/17/18 1225 100 %     Weight 05/17/18 1223 185 lb (83.9 kg)     Height 05/17/18 1223 6' (1.829 m)     Head Circumference --      Peak Flow --      Pain Score 05/17/18 1223 10     Pain Loc --      Pain Edu? --      Excl. in GC? --    No data found.  Updated Vital Signs BP (!) 135/96 (BP Location: Left Arm)   Pulse 79   Temp 97.6  F (36.4 C) (Oral)   Resp 16   Ht 6' (1.829 m)   Wt 83.9 kg   SpO2 100%   BMI 25.09 kg/m   Visual Acuity Right Eye Distance:   Left Eye Distance:   Bilateral Distance:    Right Eye Near:   Left Eye Near:    Bilateral Near:     Physical Exam Constitutional:      General: He is not in acute distress.    Appearance: He is not ill-appearing, toxic-appearing or diaphoretic.  Neck:     Musculoskeletal: Normal range of motion and neck supple. No neck rigidity.  Cardiovascular:     Rate and Rhythm: Normal rate and regular rhythm.     Pulses: Normal pulses.  Pulmonary:     Effort: Pulmonary effort is normal. No respiratory distress.     Breath sounds: Normal breath sounds.  Musculoskeletal:     Cervical back: He exhibits tenderness (paraspinous muscles).  Lymphadenopathy:     Cervical: No cervical adenopathy.  Neurological:     General: No focal deficit present.     Mental Status: He is alert and oriented to person, place, and time.     Cranial Nerves: No cranial nerve deficit.      Sensory: No sensory deficit.     Motor: No weakness.     Coordination: Coordination normal.     Gait: Gait normal.     Deep Tendon Reflexes: Reflexes normal.      UC Treatments / Results  Labs (all labs ordered are listed, but only abnormal results are displayed) Labs Reviewed - No data to display  EKG None  Radiology No results found.  Procedures Procedures (including critical care time)  Medications Ordered in UC Medications  ketorolac (TORADOL) injection 60 mg (60 mg Intramuscular Given 05/17/18 1303)    Initial Impression / Assessment and Plan / UC Course  I have reviewed the triage vital signs and the nursing notes.  Pertinent labs & imaging results that were available during my care of the patient were reviewed by me and considered in my medical decision making (see chart for details).      Final Clinical Impressions(s) / UC Diagnoses   Final diagnoses:  Cluster headache, not intractable, unspecified chronicity pattern    ED Prescriptions    Medication Sig Dispense Auth. Provider   ketorolac (TORADOL) 10 MG tablet Take 1 tablet (10 mg total) by mouth every 8 (eight) hours as needed. 15 tablet Payton Mccallumonty, Tatym Schermer, MD   cyclobenzaprine (FLEXERIL) 10 MG tablet Take 1 tablet (10 mg total) by mouth 3 (three) times daily as needed for muscle spasms. 30 tablet Payton Mccallumonty, Bayli Quesinberry, MD     1. diagnosis reviewed with patient; toradol 60 mg IM given 2. rx as per orders above; reviewed possible side effects, interactions, risks and benefits  3. Follow-up prn if symptoms worsen or don't improve   Controlled Substance Prescriptions [place] Controlled Substance Registry consulted? Not Applicable   Payton Mccallumonty, Conrad Zajkowski, MD 05/17/18 [phone]

## 2018-05-25 ENCOUNTER — Other Ambulatory Visit: Payer: Self-pay

## 2018-05-25 ENCOUNTER — Encounter: Payer: Self-pay | Admitting: Emergency Medicine

## 2018-05-25 ENCOUNTER — Emergency Department
Admission: EM | Admit: 2018-05-25 | Discharge: 2018-05-25 | Disposition: A | Discharge status: Home / Self Care | Payer: BLUE CROSS/BLUE SHIELD | Attending: Emergency Medicine | Admitting: Emergency Medicine

## 2018-05-25 DIAGNOSIS — F17210 Nicotine dependence, cigarettes, uncomplicated: Secondary | ICD-10-CM | POA: Diagnosis not present

## 2018-05-25 DIAGNOSIS — Z79899 Other long term (current) drug therapy: Secondary | ICD-10-CM | POA: Insufficient documentation

## 2018-05-25 DIAGNOSIS — R51 Headache: Secondary | ICD-10-CM | POA: Diagnosis present

## 2018-05-25 DIAGNOSIS — G44019 Episodic cluster headache, not intractable: Secondary | ICD-10-CM | POA: Insufficient documentation

## 2018-05-25 DIAGNOSIS — K253 Acute gastric ulcer without hemorrhage or perforation: Secondary | ICD-10-CM | POA: Insufficient documentation

## 2018-05-25 LAB — CBC
HEMATOCRIT: 33.7 % — AB (ref 39.0–52.0)
Hemoglobin: 11.9 g/dL — ABNORMAL LOW (ref 13.0–17.0)
MCH: 31.5 pg (ref 26.0–34.0)
MCHC: 35.3 g/dL (ref 30.0–36.0)
MCV: 89.2 fL (ref 80.0–100.0)
NRBC: 0 % (ref 0.0–0.2)
PLATELETS: 358 10*3/uL (ref 150–400)
RBC: 3.78 MIL/uL — ABNORMAL LOW (ref 4.22–5.81)
RDW: 12.5 % (ref 11.5–15.5)
WBC: 5.5 10*3/uL (ref 4.0–10.5)

## 2018-05-25 LAB — BASIC METABOLIC PANEL
ANION GAP: 10 (ref 5–15)
BUN: 15 mg/dL (ref 6–20)
CALCIUM: 9.4 mg/dL (ref 8.9–10.3)
CO2: 19 mmol/L — AB (ref 22–32)
CREATININE: 0.81 mg/dL (ref 0.61–1.24)
Chloride: 107 mmol/L (ref 98–111)
Glucose, Bld: 98 mg/dL (ref 70–99)
Potassium: 3.6 mmol/L (ref 3.5–5.1)
Sodium: 136 mmol/L (ref 135–145)

## 2018-05-25 LAB — PROTIME-INR
INR: 0.94
Prothrombin Time: 12.5 seconds (ref 11.4–15.2)

## 2018-05-25 MED ORDER — SUMATRIPTAN SUCCINATE 50 MG PO TABS: 50.0000 mg | ORAL_TABLET | Freq: Once | ORAL | 1 refills | Status: AC | PRN

## 2018-05-25 MED ORDER — SODIUM CHLORIDE 0.9 % IV BOLUS
1000.0000 mL | Freq: Once | INTRAVENOUS | Status: AC
  Administered 2018-05-25: 1000 mL via INTRAVENOUS

## 2018-05-25 MED ORDER — PANTOPRAZOLE SODIUM 40 MG PO TBEC
40.0000 mg | DELAYED_RELEASE_TABLET | Freq: Every day | ORAL | Status: DC
  Administered 2018-05-25: 40 mg via ORAL
  Filled 2018-05-25: qty 1

## 2018-05-25 MED ORDER — SUMATRIPTAN SUCCINATE 50 MG PO TABS
100.0000 mg | ORAL_TABLET | ORAL | Status: AC
  Administered 2018-05-25: 100 mg via ORAL
  Filled 2018-05-25: qty 2

## 2018-05-25 NOTE — ED Provider Notes (Signed)
Patient does report he is beginning a little bit of the discomfort on the right side of the face once again, he states he would normally take an Imitrex at home.  He would like to receive an Imitrex tablet as he suspects his pharmacy is closed today prior to discharge, I think this is acceptable.  Reports was a very mild achiness with rates of the face at this time.   Sharyn Creamer, MD 05/25/18 1320

## 2018-05-25 NOTE — ED Notes (Signed)
Wife states 3 episodes of coffee ground emesis yesterday No nausea today No dark or tarry stools

## 2018-05-25 NOTE — ED Notes (Signed)
Spoke with dr Cyril Loosen regarding pt.  No orders received at this time.  Pt given ice pack for comfort in waiting room.

## 2018-05-25 NOTE — ED Provider Notes (Signed)
[facility] Emergency Department Provider Note   ____________________________________________   First MD Initiated Contact with Patient 05/25/18 1148     (approximate)  I have reviewed the triage vital signs and the nursing notes.   HISTORY  Chief Complaint Headache    HPI Joe Guerra is a 38 y.o. male here for evaluation of headaches  Patient reports that about a month of daily right-sided headaches.  Causes right out of water, somewhat sharp in nature.  He has been diagnosed with the same and diagnosed as "cluster headaches" by neurology 2 years ago.  He seem to come and go every once or twice a year  He is tried multiple treatments including Imitrex, Toradol, been seen in urgent care couple of times.  Reports his headache will get better and then comes back  No nausea or vomiting.  No fevers or chills.  No neck pain.  Does feel like the muscles around the backside of his right neck and will tight at times.  He is taking Excedrin almost daily for this.  In addition, 2 days ago he reports his stomach got upset he vomited once and it was very flaky and a small amount of blood was seen when he vomited it.  He is not had any more pain or discomfort there and he reports is not seen any black or bloody stools.  No lightheadedness or weakness     Past Medical History:  Diagnosis Date  . Migraine     Patient Active Problem List   Diagnosis Date Noted  . Chronic bilateral low back pain without sciatica 01/16/2016  . Tobacco abuse 01/16/2016  . Headache 09/19/2014  . Intractable episodic cluster headache 09/19/2014    Past Surgical History:  Procedure Laterality Date  . WISDOM TOOTH EXTRACTION      Prior to Admission medications   Medication Sig Start Date End Date Taking? Authorizing Provider  cyclobenzaprine (FLEXERIL) 10 MG tablet Take 1 tablet (10 mg total) by mouth 3 (three) times daily as needed for muscle spasms. 05/17/18    Payton Mccallum, MD  ketorolac (TORADOL) 10 MG tablet Take 1 tablet (10 mg total) by mouth every 8 (eight) hours as needed. 05/17/18   Payton Mccallum, MD  ondansetron (ZOFRAN-ODT) 4 MG disintegrating tablet Take 1 tablet (4 mg total) by mouth every 8 (eight) hours as needed for nausea or vomiting. 05/14/18   Tommie Sams, DO  SUMAtriptan (IMITREX) 50 MG tablet Take 1 tablet (50 mg total) by mouth once as needed for headache (cluster headache). May repeat in 2 hours if headache persists or recurs. 05/25/18 05/26/19  Sharyn Creamer, MD    Allergies Influenza vaccines  Family History  Problem Relation Age of Onset  . Heart disease Father   . Thyroid cancer Mother   . Prostate cancer Paternal Grandfather   . Bladder Cancer Neg Hx   . Kidney cancer Neg Hx     Social History Social History   Tobacco Use  . Smoking status: Current Every Day Smoker    Packs/day: 0.50    Types: Cigarettes  . Smokeless tobacco: Never Used  Substance Use Topics  . Alcohol use: No  . Drug use: No    Review of Systems Constitutional: No fever/chills Eyes: No visual changes. ENT: No sore throat. Cardiovascular: Denies chest pain. Respiratory: Denies shortness of breath. Gastrointestinal: No abdominal pain.  See HPI Genitourinary: Negative for dysuria. Musculoskeletal: Negative for back pain. Skin: Negative for rash. Neurological: Negative  for areas of focal weakness or numbness.  No trouble walking or speaking.  Reports same headaches in the past multiple times.  Does not this is come on suddenly, is not the worst headache he is ever had.  Reports he has had a CAT scan and see neurology for the same several years ago.  Has an appointment with neurology coming up in the next week.  Reports he ran out of Imitrex would like a refill  ____________________________________________   PHYSICAL EXAM:  VITAL SIGNS: ED Triage Vitals  Enc Vitals Group     BP 05/25/18 1006 (!) 143/84     Pulse Rate 05/25/18 1006  91     Resp 05/25/18 1006 (!) 22     Temp 05/25/18 1006 98.2 F (36.8 C)     Temp Source 05/25/18 1006 Oral     SpO2 05/25/18 1006 100 %     Weight 05/25/18 0958 184 lb 15.5 oz (83.9 kg)     Height --      Head Circumference --      Peak Flow --      Pain Score 05/25/18 0958 10     Pain Loc --      Pain Edu? --      Excl. in GC? --     Constitutional: Alert and oriented. Well appearing and in no acute distress but does hold his right hand over his right forehead. Eyes: Conjunctivae are normal. Head: Atraumatic. Nose: No congestion/rhinnorhea. Mouth/Throat: Mucous membranes are moist. Neck: No stridor.  Cardiovascular: Normal rate, regular rhythm. Grossly normal heart sounds.  Good peripheral circulation. Respiratory: Normal respiratory effort.  No retractions. Lungs CTAB. Gastrointestinal: Soft and nontender. No distention. Musculoskeletal: No lower extremity tenderness nor edema. Neurologic:  Normal speech and language. No gross focal neurologic deficits are appreciated.  Normal and clear speech.  No ataxia.  Normal mental status.  No photophobia. Skin:  Skin is warm, dry and intact. No rash noted. Psychiatric: Mood and affect are normal. Speech and behavior are normal.  ____________________________________________   LABS (all labs ordered are listed, but only abnormal results are displayed)  Labs Reviewed  CBC - Abnormal; Notable for the following components:      Result Value   RBC 3.78 (*)    Hemoglobin 11.9 (*)    HCT 33.7 (*)    All other components within normal limits  BASIC METABOLIC PANEL - Abnormal; Notable for the following components:   CO2 19 (*)    All other components within normal limits  PROTIME-INR   ____________________________________________  EKG   ____________________________________________  RADIOLOGY Reviewed outpatient records, patient does have a previous normal head CT performed in his history, was several years ago but reports he was  having headache symptoms that were the same at that time.  ____________________________________________   PROCEDURES  Procedure(s) performed: None  Procedures  Critical Care performed: No  ____________________________________________   INITIAL IMPRESSION / ASSESSMENT AND PLAN / ED COURSE  Pertinent labs & imaging results that were available during my care of the patient were reviewed by me and considered in my medical decision making (see chart for details).   Differential diagnosis includes but is not exclusive to subarachnoid hemorrhage, meningitis, encephalitis, previous head trauma, cavernous venous thrombosis, muscle tension headache, temporal arteritis, migraine or migraine equivalent, etc.  In given the history and examination appears consistent with cluster headache.  Clinical Course as of May 26 1255  Wed May 25, 2018  1254 No prior hgb  for comparison.  Patient denies any issues of ongoing vomiting or bloody emesis or abdominal pain over the last day.  Hemoglobin is 11.9, no previous for comparison.  He does not have symptoms suggestive of ongoing gastrointestinal hemorrhage.   [MQ]  1254 Patient reports his headaches completely resolved at this time with oxygen therapy.  Lab work reassuring, will discharge home and has a follow-up plan already in place with neurology.   [MQ]  1255 Counseled patient that he must stop use of Excedrin, not use aspirin, BC powders, ibuprofen Motrin or other NSAID medications.  He is in agreement.   [MQ]    Clinical Course User Index [MQ] Sharyn CreamerQuale, Maui Ahart, MD   Patient examination consistent with cluster headache.  Symptoms consistent with.  No signs or symptoms of red flags suggest need for CT imaging, lumbar puncture or MRI at this time.  Return precautions and treatment recommendations and follow-up discussed with the patient who is agreeable with the plan.   ____________________________________________   FINAL CLINICAL IMPRESSION(S) /  ED DIAGNOSES  Final diagnoses:  Acute gastric ulcer, unspecified whether gastric ulcer hemorrhage or perforation present  Episodic cluster headache, not intractable        Note:  This document was prepared using Dragon voice recognition software and may include unintentional dictation errors       Sharyn CreamerQuale, Coley Littles, MD 05/25/18 1304

## 2018-05-25 NOTE — ED Triage Notes (Signed)
Pt to ed with c/o hx of chronic cluster headaches.  Pt states seen intermittently over the last 3 weeks for the same repeated problem.  Pt reports appt on Jan 9th with neuro for same.  Pt crying loudly at triage at this time.  Family with pt.

## 2018-05-25 NOTE — Discharge Instructions (Addendum)
You have been seen in the Emergency Department (ED) for a headache.    As we have discussed, please follow up with your primary care doctor as soon as possible regarding todays Emergency Department (ED) visit and your headache symptoms.    Call your doctor or return to the ED if you have a worsening headache, sudden and severe headache, confusion, slurred speech, facial droop, weakness or numbness in any arm or leg, extreme fatigue, vision problems, or other symptoms that concern you.  Please begin taking an over the counter acid medication such as prilosec or nexium as advised on packaging for at least 2 weeks. Follow-up with gastroenterology as I suspect you have developed an ulcer from your use of excedrin. Please STOP use of all NSAID medications such as aspirin, ibuprofen, naproxen.  Please return to the emergency room right away if you are to develop a fever, severe nausea, your pain becomes severe or worsens, you are unable to keep food down, begin vomiting any dark or bloody fluid, you develop any dark or bloody stools, feel dehydrated, or other new concerns or symptoms arise.

## 2018-12-18 ENCOUNTER — Emergency Department: Payer: BC Managed Care – PPO

## 2018-12-18 ENCOUNTER — Inpatient Hospital Stay
Admission: EM | Admit: 2018-12-18 | Discharge: 2018-12-20 | DRG: 201 | Disposition: A | Discharge status: Home / Self Care | Payer: BC Managed Care – PPO | Source: Ambulatory Visit | Attending: General Surgery | Admitting: General Surgery

## 2018-12-18 ENCOUNTER — Ambulatory Visit (INDEPENDENT_AMBULATORY_CARE_PROVIDER_SITE_OTHER): Payer: BC Managed Care – PPO

## 2018-12-18 ENCOUNTER — Ambulatory Visit (INDEPENDENT_AMBULATORY_CARE_PROVIDER_SITE_OTHER)
Admission: EM | Admit: 2018-12-18 | Discharge: 2018-12-18 | Disposition: A | Discharge status: Other Acute Inpatient Hospital | Payer: BC Managed Care – PPO | Source: Home / Self Care | Attending: Family Medicine | Admitting: Family Medicine

## 2018-12-18 ENCOUNTER — Other Ambulatory Visit: Payer: Self-pay

## 2018-12-18 ENCOUNTER — Encounter: Payer: Self-pay | Admitting: Emergency Medicine

## 2018-12-18 DIAGNOSIS — Z09 Encounter for follow-up examination after completed treatment for conditions other than malignant neoplasm: Secondary | ICD-10-CM

## 2018-12-18 DIAGNOSIS — J9383 Other pneumothorax: Secondary | ICD-10-CM

## 2018-12-18 DIAGNOSIS — Z20828 Contact with and (suspected) exposure to other viral communicable diseases: Secondary | ICD-10-CM | POA: Diagnosis present

## 2018-12-18 DIAGNOSIS — R0789 Other chest pain: Secondary | ICD-10-CM | POA: Diagnosis not present

## 2018-12-18 DIAGNOSIS — F17210 Nicotine dependence, cigarettes, uncomplicated: Secondary | ICD-10-CM | POA: Diagnosis present

## 2018-12-18 DIAGNOSIS — Z887 Allergy status to serum and vaccine status: Secondary | ICD-10-CM

## 2018-12-18 DIAGNOSIS — Z79899 Other long term (current) drug therapy: Secondary | ICD-10-CM | POA: Diagnosis not present

## 2018-12-18 DIAGNOSIS — Z8249 Family history of ischemic heart disease and other diseases of the circulatory system: Secondary | ICD-10-CM

## 2018-12-18 DIAGNOSIS — R079 Chest pain, unspecified: Secondary | ICD-10-CM

## 2018-12-18 LAB — CBC WITH DIFFERENTIAL/PLATELET
Abs Immature Granulocytes: 0.06 10*3/uL (ref 0.00–0.07)
Basophils Absolute: 0.1 10*3/uL (ref 0.0–0.1)
Basophils Relative: 1 %
Eosinophils Absolute: 0.2 10*3/uL (ref 0.0–0.5)
Eosinophils Relative: 1 %
HCT: 43.8 % (ref 39.0–52.0)
Hemoglobin: 15.1 g/dL (ref 13.0–17.0)
Immature Granulocytes: 0 %
Lymphocytes Relative: 13 %
Lymphs Abs: 1.8 10*3/uL (ref 0.7–4.0)
MCH: 30.9 pg (ref 26.0–34.0)
MCHC: 34.5 g/dL (ref 30.0–36.0)
MCV: 89.8 fL (ref 80.0–100.0)
Monocytes Absolute: 1 10*3/uL (ref 0.1–1.0)
Monocytes Relative: 7 %
Neutro Abs: 11 10*3/uL — ABNORMAL HIGH (ref 1.7–7.7)
Neutrophils Relative %: 78 %
Platelets: 285 10*3/uL (ref 150–400)
RBC: 4.88 MIL/uL (ref 4.22–5.81)
RDW: 12.7 % (ref 11.5–15.5)
WBC: 14.1 10*3/uL — ABNORMAL HIGH (ref 4.0–10.5)
nRBC: 0 % (ref 0.0–0.2)

## 2018-12-18 LAB — COMPREHENSIVE METABOLIC PANEL
ALT: 16 U/L (ref 0–44)
AST: 16 U/L (ref 15–41)
Albumin: 4.3 g/dL (ref 3.5–5.0)
Alkaline Phosphatase: 50 U/L (ref 38–126)
Anion gap: 9 (ref 5–15)
BUN: 8 mg/dL (ref 6–20)
CO2: 23 mmol/L (ref 22–32)
Calcium: 9.6 mg/dL (ref 8.9–10.3)
Chloride: 107 mmol/L (ref 98–111)
Creatinine, Ser: 0.83 mg/dL (ref 0.61–1.24)
GFR calc Af Amer: >60 mL/min (ref >60)
GFR calc non Af Amer: >60 mL/min (ref >60)
Glucose, Bld: 90 mg/dL (ref 70–99)
Potassium: 4 mmol/L (ref 3.5–5.1)
Sodium: 139 mmol/L (ref 135–145)
Total Bilirubin: 0.6 mg/dL (ref 0.3–1.2)
Total Protein: 7.4 g/dL (ref 6.5–8.1)

## 2018-12-18 LAB — SARS CORONAVIRUS 2 BY RT PCR (HOSPITAL ORDER, PERFORMED IN ~~LOC~~ HOSPITAL LAB): SARS Coronavirus 2: NEGATIVE

## 2018-12-18 LAB — MRSA PCR SCREENING: MRSA by PCR: POSITIVE — AB

## 2018-12-18 LAB — TROPONIN I (HIGH SENSITIVITY): Troponin I (High Sensitivity): <2 ng/L (ref <18)

## 2018-12-18 MED ORDER — MUPIROCIN 2 % EX OINT
1.0000 "application " | TOPICAL_OINTMENT | Freq: Two times a day (BID) | CUTANEOUS | Status: DC
  Administered 2018-12-18 – 2018-12-20 (×4): 1 via NASAL
  Filled 2018-12-18: qty 22

## 2018-12-18 MED ORDER — FENTANYL CITRATE (PF) 100 MCG/2ML IJ SOLN
50.0000 ug | INTRAMUSCULAR | Status: DC | PRN
  Administered 2018-12-18: 50 ug via INTRAVENOUS
  Filled 2018-12-18: qty 2

## 2018-12-18 MED ORDER — MIDAZOLAM HCL 2 MG/2ML IJ SOLN
1.0000 mg | Freq: Once | INTRAMUSCULAR | Status: AC
  Administered 2018-12-18: 1 mg via INTRAVENOUS
  Filled 2018-12-18: qty 2

## 2018-12-18 MED ORDER — ACETAMINOPHEN 650 MG RE SUPP: 650.0000 mg | Freq: Four times a day (QID) | RECTAL | Status: DC | PRN

## 2018-12-18 MED ORDER — ONDANSETRON 4 MG PO TBDP: 4.0000 mg | ORAL_TABLET | Freq: Four times a day (QID) | ORAL | Status: DC | PRN

## 2018-12-18 MED ORDER — VITAMIN C 500 MG PO TABS
250.0000 mg | ORAL_TABLET | Freq: Every day | ORAL | Status: DC
  Administered 2018-12-18 – 2018-12-20 (×3): 250 mg via ORAL
  Filled 2018-12-18 (×2): qty 1
  Filled 2018-12-18: qty 0.5
  Filled 2018-12-18: qty 1

## 2018-12-18 MED ORDER — CHLORHEXIDINE GLUCONATE CLOTH 2 % EX PADS
6.0000 | MEDICATED_PAD | Freq: Every day | CUTANEOUS | Status: DC
  Administered 2018-12-19 – 2018-12-20 (×2): 6 via TOPICAL

## 2018-12-18 MED ORDER — KETOROLAC TROMETHAMINE 60 MG/2ML IM SOLN
60.0000 mg | Freq: Once | INTRAMUSCULAR | Status: AC
  Administered 2018-12-18: 60 mg via INTRAMUSCULAR

## 2018-12-18 MED ORDER — ACETAMINOPHEN 325 MG PO TABS: 650.0000 mg | ORAL_TABLET | Freq: Four times a day (QID) | ORAL | Status: DC | PRN

## 2018-12-18 MED ORDER — HYDROCODONE-ACETAMINOPHEN 5-325 MG PO TABS
1.0000 | ORAL_TABLET | ORAL | Status: DC | PRN
  Administered 2018-12-19: 1 via ORAL
  Filled 2018-12-18: qty 1

## 2018-12-18 MED ORDER — PANTOPRAZOLE SODIUM 40 MG IV SOLR
40.0000 mg | Freq: Every day | INTRAVENOUS | Status: DC
  Administered 2018-12-18 – 2018-12-19 (×2): 40 mg via INTRAVENOUS
  Filled 2018-12-18 (×2): qty 40

## 2018-12-18 MED ORDER — NORTRIPTYLINE HCL 10 MG PO CAPS
10.0000 mg | ORAL_CAPSULE | Freq: Every day | ORAL | Status: DC
  Administered 2018-12-18 – 2018-12-19 (×2): 10 mg via ORAL
  Filled 2018-12-18 (×5): qty 1

## 2018-12-18 MED ORDER — BUPIVACAINE HCL (PF) 0.5 % IJ SOLN
30.0000 mL | Freq: Once | INTRAMUSCULAR | Status: AC
  Administered 2018-12-18: 30 mL
  Filled 2018-12-18: qty 30

## 2018-12-18 MED ORDER — MORPHINE SULFATE (PF) 4 MG/ML IV SOLN
4.0000 mg | INTRAVENOUS | Status: DC | PRN
  Administered 2018-12-18 – 2018-12-19 (×4): 4 mg via INTRAVENOUS
  Filled 2018-12-18 (×4): qty 1

## 2018-12-18 MED ORDER — ENOXAPARIN SODIUM 40 MG/0.4ML ~~LOC~~ SOLN
40.0000 mg | SUBCUTANEOUS | Status: DC
  Administered 2018-12-18 – 2018-12-19 (×2): 40 mg via SUBCUTANEOUS
  Filled 2018-12-18 (×2): qty 0.4

## 2018-12-18 MED ORDER — ONDANSETRON HCL 4 MG/2ML IJ SOLN: 4.0000 mg | Freq: Four times a day (QID) | INTRAMUSCULAR | Status: DC | PRN

## 2018-12-18 MED ORDER — MAGNESIUM OXIDE 400 (241.3 MG) MG PO TABS
400.0000 mg | ORAL_TABLET | Freq: Every day | ORAL | Status: DC
  Administered 2018-12-18 – 2018-12-19 (×2): 400 mg via ORAL
  Filled 2018-12-18 (×2): qty 1

## 2018-12-18 MED ORDER — SUMATRIPTAN SUCCINATE 50 MG PO TABS
50.0000 mg | ORAL_TABLET | Freq: Once | ORAL | Status: DC | PRN
  Filled 2018-12-18: qty 1

## 2018-12-18 NOTE — H&P (Signed)
SURGICAL CONSULTATION NOTE   HISTORY OF PRESENT ILLNESS (HPI):  38 y.o. male presented to [facility] ED for evaluation of chest pain on the left side.  Patient reported that he started with chest pain last night.  He went to sleep and the pain woke him up.  Pain does not radiate to other part of body.  There is no alleviating or aggravating factor.  Patient reports mild shortness of breath but no significant fatigue.  Denies fever chills.  Denies nausea or vomiting.  Denies abdominal pain.  Patient denies any trauma.  He is a smoker.  Surgery is consulted by Dr. Roxan Hockeyobinson in this context for evaluation and management of spontaneous pneumothorax.  PAST MEDICAL HISTORY (PMH):  Past Medical History:  Diagnosis Date  . Migraine      PAST SURGICAL HISTORY (PSH):  Past Surgical History:  Procedure Laterality Date  . WISDOM TOOTH EXTRACTION       MEDICATIONS:  Prior to Admission medications   Medication Sig Start Date End Date Taking? Authorizing Provider  Magnesium 500 MG TABS Take 500 mg by mouth at bedtime.   Yes [provider]  nortriptyline (PAMELOR) 10 MG capsule Take 10 mg by mouth at bedtime.  07/31/18  Yes [provider]  vitamin C (ASCORBIC ACID) 250 MG tablet Take 250 mg by mouth daily.   Yes [provider]  SUMAtriptan (IMITREX) 50 MG tablet Take 1 tablet (50 mg total) by mouth once as needed for headache (cluster headache). May repeat in 2 hours if headache persists or recurs. 05/25/18 05/26/19  Sharyn CreamerQuale, Mark, MD     ALLERGIES:  Allergies  Allergen Reactions  . Influenza Vaccines Other (See Comments)    "sick"     SOCIAL HISTORY:  Social History   Socioeconomic History  . Marital status: Married    Spouse name: Not on file  . Number of children: Not on file  . Years of education: Not on file  . Highest education level: Not on file  Occupational History  . Not on file  Social Needs  . Financial resource strain: Not on file  . Food insecurity   Worry: Not on file    Inability: Not on file  . Transportation needs    Medical: Not on file    Non-medical: Not on file  Tobacco Use  . Smoking status: Current Every Day Smoker    Packs/day: 0.50    Types: Cigarettes  . Smokeless tobacco: Never Used  Substance and Sexual Activity  . Alcohol use: No  . Drug use: No  . Sexual activity: Not on file  Lifestyle  . Physical activity    Days per week: Not on file    Minutes per session: Not on file  . Stress: Not on file  Relationships  . Social Musicianconnections    Talks on phone: Not on file    Gets together: Not on file    Attends religious service: Not on file    Active member of club or organization: Not on file    Attends meetings of clubs or organizations: Not on file    Relationship status: Not on file  . Intimate partner violence    Fear of current or ex partner: Not on file    Emotionally abused: Not on file    Physically abused: Not on file    Forced sexual activity: Not on file  Other Topics Concern  . Not on file  Social History Narrative  . Not on  file    The patient currently resides (home / rehab facility / nursing home): Home The patient normally is (ambulatory / bedbound): Ambulatory   FAMILY HISTORY:  Family History  Problem Relation Age of Onset  . Heart disease Father   . Thyroid cancer Mother   . Prostate cancer Paternal Grandfather   . Bladder Cancer Neg Hx   . Kidney cancer Neg Hx      REVIEW OF SYSTEMS:  Constitutional: denies weight loss, fever, chills, or sweats  Eyes: denies any other vision changes, history of eye injury  ENT: denies sore throat, hearing problems  Respiratory: Mild shortness of breath, denies wheezing  Cardiovascular: Positive chest pain, negative palpitations  Gastrointestinal: denies abdominal pain, N/V, or diarrhea Genitourinary: denies burning with urination or urinary frequency Musculoskeletal: denies any other joint pains or cramps  Skin: denies any other rashes or  skin discolorations  Neurological: denies any other headache, dizziness, weakness  Psychiatric: denies any other depression, anxiety   All other review of systems were negative   VITAL SIGNS:  Temp:  [97.7 F (36.5 C)-98 F (36.7 C)] 98 F (36.7 C) (07/26 1305) Pulse Rate:  [70-78] 74 (07/26 1400) Resp:  [12-22] 20 (07/26 1430) BP: (143-158)/(91-95) 158/95 (07/26 1430) SpO2:  [99 %-100 %] 100 % (07/26 1400) Weight:  [83.9 kg] 83.9 kg (07/26 1305)     Height: 6' (182.9 cm) Weight: 83.9 kg BMI (Calculated): 25.08   INTAKE/OUTPUT:  This shift: No intake/output data recorded.  Last 2 shifts: @IOLAST2SHIFTS @   PHYSICAL EXAM:  Constitutional:  -- Normal body habitus  -- Awake, alert, and oriented x3  Eyes:  -- Pupils equally round and reactive to light  -- No scleral icterus  Ear, nose, and throat:  -- No jugular venous distension  Pulmonary:  -- No crackles  -- Equal breath sounds bilaterally -- Breathing non-labored at rest Cardiovascular:  -- S1, S2 present  -- No pericardial rubs Gastrointestinal:  -- Abdomen soft, nontender, non-distended, no guarding or rebound tenderness -- No abdominal masses appreciated, pulsatile or otherwise  Musculoskeletal and Integumentary:  -- Wounds or skin discoloration: None appreciated -- Extremities: B/L UE and LE FROM, hands and feet warm, no edema  Neurologic:  -- Motor function: intact and symmetric -- Sensation: intact and symmetric   Labs:  CBC Latest Ref Rng & Units 12/18/2018 05/25/2018  WBC 4.0 - 10.5 K/uL 14.1(H) 5.5  Hemoglobin 13.0 - 17.0 g/dL 40.915.1 11.9(L)  Hematocrit 39.0 - 52.0 % 43.8 33.7(L)  Platelets 150 - 400 K/uL 285 358   CMP Latest Ref Rng & Units 12/18/2018 05/25/2018 07/13/2016  Glucose 70 - 99 mg/dL 90 98 -  BUN 6 - 20 mg/dL 8 15 14   Creatinine 0.61 - 1.24 mg/dL 8.110.83 9.140.81 7.820.95  Sodium 135 - 145 mmol/L 139 136 -  Potassium 3.5 - 5.1 mmol/L 4.0 3.6 -  Chloride 98 - 111 mmol/L 107 107 -  CO2 22 - 32 mmol/L 23  19(L) -  Calcium 8.9 - 10.3 mg/dL 9.6 9.4 -  Total Protein 6.5 - 8.1 g/dL 7.4 - -  Total Bilirubin 0.3 - 1.2 mg/dL 0.6 - -  Alkaline Phos 38 - 126 U/L 50 - -  AST 15 - 41 U/L 16 - -  ALT 0 - 44 U/L 16 - -    Imaging studies:  I personally evaluated the chest x-rays.  There is an atrial before chest tube that shows moderate-sized left-sided pneumothorax.  I also evaluated the chest  x-ray post chest tube.  There was expansion of the lung with decreased size of the pneumothorax.  Assessment/Plan:  38 y.o. male with spontaneous pneumothorax, complicated by pertinent comorbidities including smoker and migraine.  Patient with a spontaneous pneumothorax.  There is no history of trauma.  At this moment there is no shortness of breath.  Patient reported mild chest pain on the left side that has not progressed.  Somewhat controlled with pain medication.  Chest tube placed by ED.  There was improvement of the pneumothorax.  Will admit patient for treatment of splinting admitted with chest tube.  Patient oriented on how to do incentive spirometer.  Chest tube to suction.  Will start DVT prophylaxis.  Will contact thoracic surgeon Dr. Genevive Bi for further recommendation and management.  Arnold Long, MD

## 2018-12-18 NOTE — ED Triage Notes (Signed)
Pt  Arrives via [facility] from [facility] urgent [facility]. Pt went to the [facility] urgent care for left upper lung pain. x-ray taken at urgent care confirmed partial collapse of left lung. No trauma. Pt is current every day Smoker. 96% on room air. Pt respirations even and unlabored.

## 2018-12-18 NOTE — ED Triage Notes (Addendum)
Pt states since early yesterday morning has been having left sided chest pain that radiates to his left arm and feels tingling in his left arm. States he is having some SOB and that it hurts to breathe. No cold or cough reported. Pt reports the chest pain as a short stabbing pain, no history of this before but does have a smoking history. Did take some tylenol this morning. Laying flat seems to make the pain worse in his chest. Pt is currently propped up

## 2018-12-18 NOTE — ED Notes (Addendum)
MD Quentin Cornwall at bedside to insert chest tube.

## 2018-12-18 NOTE — ED Notes (Signed)
.. ED TO INPATIENT HANDOFF REPORT  ED Nurse Name and Phone #: Pattricia Bossnnie [phone]  S Name/Age/Gender Joe Guerra 38 y.o. male Room/Bed: ED26A/ED26A  Code Status   Code Status: Not on file  Home/SNF/Other Home Patient oriented to: self, place, time and situation Is this baseline? Yes   Triage Complete: Triage complete  Chief Complaint possible collapsed lung, [facility]  Triage Note Pt  [facility]. [facility] for left upper lung pain. x-ray taken at urgent care confirmed partial collapse of left lung. No trauma. Pt is current every day Smoker. 96% on room air. Pt respirations even and unlabored.    Allergies Allergies  Allergen Reactions  . Influenza Vaccines Other (See Comments)    "sick"    Level of Care/Admitting Diagnosis ED Disposition    ED Disposition Condition Comment   Admit  The patient appears reasonably stabilized for admission considering the current resources, flow, and capabilities available in the ED at this time, and I doubt any other [facility] requiring further screening and/or treatment in the ED prior to admission is  present.       B Medical/Surgery History Past Medical History:  Diagnosis Date  . Migraine    Past Surgical History:  Procedure Laterality Date  . WISDOM TOOTH EXTRACTION       A IV Location/Drains/Wounds Patient Lines/Drains/Airways Status   Active Line/Drains/Airways    Name:   Placement date:   Placement time:   Site:   Days:   Peripheral IV 12/18/18 Left Antecubital   12/18/18    1324    Antecubital   less than 1   Peripheral IV 12/18/18 Right Antecubital   12/18/18    1341    Antecubital   less than 1          Intake/Output Last 24 hours No intake or output data in the 24 hours ending 12/18/18 1448  Labs/Imaging Results for orders placed or performed during the hospital encounter of 12/18/18 (from the past 48 hour(s))  CBC with  Differential/Platelet     Status: Abnormal   Collection Time: 12/18/18  1:00 PM  Result Value Ref Range   WBC 14.1 (H) 4.0 - 10.5 K/uL   RBC 4.88 4.22 - 5.81 MIL/uL   Hemoglobin 15.1 13.0 - 17.0 g/dL   HCT 11.943.8 14.739.0 - 82.952.0 %   MCV 89.8 80.0 - 100.0 fL   MCH 30.9 26.0 - 34.0 pg   MCHC 34.5 30.0 - 36.0 g/dL   RDW 56.212.7 13.011.5 - 86.515.5 %   Platelets 285 150 - 400 K/uL   nRBC 0.0 0.0 - 0.2 %   Neutrophils Relative % 78 %   Neutro Abs 11.0 (H) 1.7 - 7.7 K/uL   Lymphocytes Relative 13 %   Lymphs Abs 1.8 0.7 - 4.0 K/uL   Monocytes Relative 7 %   Monocytes Absolute 1.0 0.1 - 1.0 K/uL   Eosinophils Relative 1 %   Eosinophils Absolute 0.2 0.0 - 0.5 K/uL   Basophils Relative 1 %   Basophils Absolute 0.1 0.0 - 0.1 K/uL   Immature Granulocytes 0 %   Abs Immature Granulocytes 0.06 0.00 - 0.07 K/uL    Comment: Performed at [facility], [address] [place], [place] [address]  Comprehensive metabolic panel     Status: None   Collection Time: 12/18/18  1:00 PM  Result Value Ref Range   Sodium 139 135 -  145 mmol/L   Potassium 4.0 3.5 - 5.1 mmol/L   Chloride 107 98 - 111 mmol/L   CO2 23 22 - 32 mmol/L   Glucose, Bld 90 70 - 99 mg/dL   BUN 8 6 - 20 mg/dL   Creatinine, Ser 1.610.83 0.61 - 1.24 mg/dL   Calcium 9.6 8.9 - 09.610.3 mg/dL   Total Protein 7.4 6.5 - 8.1 g/dL   Albumin 4.3 3.5 - 5.0 g/dL   AST 16 15 - 41 U/L   ALT 16 0 - 44 U/L   Alkaline Phosphatase 50 38 - 126 U/L   Total Bilirubin 0.6 0.3 - 1.2 mg/dL   GFR calc non Af Amer >60 >60 mL/min   GFR calc Af Amer >60 >60 mL/min   Anion gap 9 5 - 15    Comment: Performed at [facility], [address] [place], [place] [address]  SARS Coronavirus 2 (CEPHEID- Performed in [facility] lab), Hosp Order     Status: None   Collection Time: 12/18/18  1:08 PM   Specimen: Nasopharyngeal Swab  Result Value Ref Range   SARS Coronavirus 2 NEGATIVE NEGATIVE    Comment: (NOTE) If result is NEGATIVE SARS-CoV-2 target  nucleic acids are NOT DETECTED. The SARS-CoV-2 RNA is generally detectable in upper and lower  respiratory specimens during the acute phase of infection. The lowest  concentration of SARS-CoV-2 viral copies this assay can detect is 250  copies / mL. A negative result does not preclude SARS-CoV-2 infection  and should not be used as the sole basis for treatment or other  patient management decisions.  A negative result may occur with  improper specimen collection / handling, submission of specimen other  than nasopharyngeal swab, presence of viral mutation(s) within the  areas targeted by this assay, and inadequate number of viral copies  (<250 copies / mL). A negative result must be combined with clinical  observations, patient history, and epidemiological information. If result is POSITIVE SARS-CoV-2 target nucleic acids are DETECTED. The SARS-CoV-2 RNA is generally detectable in upper and lower  respiratory specimens dur ing the acute phase of infection.  Positive  results are indicative of active infection with SARS-CoV-2.  Clinical  correlation with patient history and other diagnostic information is  necessary to determine patient infection status.  Positive results do  not rule out bacterial infection or co-infection with other viruses. If result is PRESUMPTIVE POSTIVE SARS-CoV-2 nucleic acids MAY BE PRESENT.   A presumptive positive result was obtained on the submitted specimen  and confirmed on repeat testing.  While 2019 novel coronavirus  (SARS-CoV-2) nucleic acids may be present in the submitted sample  additional confirmatory testing may be necessary for epidemiological  and / or clinical management purposes  to differentiate between  SARS-CoV-2 and other Sarbecovirus currently known to infect humans.  If clinically indicated additional testing with an alternate test  methodology [phone]([id number]) is advised. The SARS-CoV-2 RNA is generally  detectable in upper and lower  respiratory sp ecimens during the acute  phase of infection. The expected result is Negative. Fact Sheet for Patients:  BoilerBrush.com.cyhttps://www.fda.gov/media/136312/download Fact Sheet for Healthcare Providers: https://pope.com/https://www.fda.gov/media/136313/download This test is not yet approved or cleared by the [place] FDA and has been authorized for detection and/or diagnosis of SARS-CoV-2 by FDA under an Emergency Use Authorization (EUA).  This EUA will remain in effect (meaning this test can be used) for the duration of the COVID-19 declaration under Section 564(b)(1) of the Act, 21 U.S.C.  section 360bbb-3(b)(1), unless the authorization is terminated or revoked sooner. Performed at [facility], [address] [place], [place] [address]    Dg Chest 2 View  Result Date: 12/18/2018 CLINICAL DATA:  38 year old male with acute LEFT chest pain. EXAM: CHEST - 2 VIEW COMPARISON:  02/02/2012 FINDINGS: A moderate LEFT pneumothorax is noted with apical and basilar components. There is no evidence of mediastinal shift. No airspace disease, consolidation, mass or RIGHT pneumothorax noted. There may be a trace LEFT pleural effusion present. No acute bony abnormalities are identified. IMPRESSION: Moderate LEFT pneumothorax without mediastinal shift. Critical Value/emergent results were called by telephone at the time of interpretation on 12/18/2018 at 12:02 pm to Dr. Norval Gable , who verbally acknowledged these results. Electronically Signed   By: Margarette Canada M.D.   On: 12/18/2018 12:02   Dg Chest Portable 1 View  Result Date: 12/18/2018 CLINICAL DATA:  Follow-up pneumothorax EXAM: PORTABLE CHEST 1 VIEW COMPARISON:  Chest radiograph performed today at a [facility] FINDINGS: The cardiomediastinal silhouette is unremarkable. A moderate apical and basilar LEFT pneumothorax is again noted, with a basilar component smaller than earlier today. No airspace disease, consolidation, mass or definite  pleural effusion. No acute bony abnormalities are noted. IMPRESSION: Moderate LEFT pneumothorax again noted with slightly decreased basilar component. Electronically Signed   By: Margarette Canada M.D.   On: 12/18/2018 13:32    Pending Labs Unresulted Labs (From admission, onward)   None      Vitals/Pain Today's Vitals   12/18/18 1345 12/18/18 1400 12/18/18 1406 12/18/18 1430  BP:  (!) 143/95  (!) 158/95  Pulse:  74    Resp: 12 (!) 22  20  Temp:      TempSrc:      SpO2:  100%    Weight:      Height:      PainSc:   8      Isolation Precautions Airborne and Contact precautions  Medications Medications  fentaNYL (SUBLIMAZE) injection 50 mcg (50 mcg Intravenous Given 12/18/18 1406)  midazolam (VERSED) injection 1 mg (1 mg Intravenous Given 12/18/18 1406)  bupivacaine (MARCAINE) 0.5 % injection 30 mL (30 mLs Infiltration Given 12/18/18 1406)    Mobility walks Low fall risk   Focused Assessments Pulmonary Assessment Handoff:  Lung sounds: L Breath Sounds: Absent(Upper lobe of left lung) O2 Device: Nasal Cannula O2 Flow Rate (L/min): 2 L/min      R Recommendations: See Admitting Provider Note  Report given to:   Additional Notes:

## 2018-12-18 NOTE — ED Notes (Signed)
Pt placed on 2L nasal cannula per MD Quentin Cornwall.

## 2018-12-18 NOTE — ED Provider Notes (Signed)
[facility]    CSN: [id number] Arrival date & time: 12/18/18  1059     History   Chief Complaint Chief Complaint  Patient presents with  . Chest Pain    HPI Joe Guerra is a 38 y.o. male.   38 yo male smoker (1/2 ppd) presents with a c/o sudden onset of "sharp", "stabbing" left sided chest pain since yesterday. States shortly after waking up yesterday he started experiencing the pain which has been constant with intermittent waxing and waning. States pain is worsened with deep breathing or movement. States yesterday felt some left arm dull pain as well but has now resolved. Feels slightly short of breath.  Denies any neck or jaw pain, fevers, chills, cough, wheezing, injury/fall.      Past Medical History:  Diagnosis Date  . Migraine     Patient Active Problem List   Diagnosis Date Noted  . Chronic bilateral low back pain without sciatica 01/16/2016  . Tobacco abuse 01/16/2016  . Headache 09/19/2014  . Intractable episodic cluster headache 09/19/2014    Past Surgical History:  Procedure Laterality Date  . WISDOM TOOTH EXTRACTION         Home Medications    Prior to Admission medications   Medication Sig Start Date End Date Taking? Authorizing Provider  nortriptyline (PAMELOR) 10 MG capsule PLEASE SEE ATTACHED FOR DETAILED DIRECTIONS 07/31/18  Yes [provider]  cyclobenzaprine (FLEXERIL) 10 MG tablet Take 1 tablet (10 mg total) by mouth 3 (three) times daily as needed for muscle spasms. 05/17/18   Payton Mccallumonty, Josanna Hefel, MD  ketorolac (TORADOL) 10 MG tablet Take 1 tablet (10 mg total) by mouth every 8 (eight) hours as needed. 05/17/18   Payton Mccallumonty, Kora Groom, MD  ondansetron (ZOFRAN-ODT) 4 MG disintegrating tablet Take 1 tablet (4 mg total) by mouth every 8 (eight) hours as needed for nausea or vomiting. 05/14/18   Tommie Samsook, Jayce G, DO  SUMAtriptan (IMITREX) 50 MG tablet Take 1 tablet (50 mg total) by mouth once as needed for headache (cluster  headache). May repeat in 2 hours if headache persists or recurs. 05/25/18 05/26/19  Sharyn CreamerQuale, Mark, MD    Family History Family History  Problem Relation Age of Onset  . Heart disease Father   . Thyroid cancer Mother   . Prostate cancer Paternal Grandfather   . Bladder Cancer Neg Hx   . Kidney cancer Neg Hx     Social History Social History   Tobacco Use  . Smoking status: Current Every Day Smoker    Packs/day: 0.50    Types: Cigarettes  . Smokeless tobacco: Never Used  Substance Use Topics  . Alcohol use: No  . Drug use: No     Allergies   Influenza vaccines   Review of Systems Review of Systems   Physical Exam Triage Vital Signs ED Triage Vitals  Enc Vitals Group     BP 12/18/18 1105 (!) 147/94     Pulse Rate 12/18/18 1105 78     Resp 12/18/18 1105 18     Temp 12/18/18 1105 97.7 F (36.5 C)     Temp Source 12/18/18 1105 Oral     SpO2 12/18/18 1105 99 %     Weight 12/18/18 1106 185 lb (83.9 kg)     Height 12/18/18 1106 6' (1.829 m)     Head Circumference --      Peak Flow --      Pain Score 12/18/18 1106 6     Pain  Loc --      Pain Edu? --      Excl. in [place]? --    No data found.  Updated Vital Signs BP (!) 147/94 (BP Location: Right Arm)   Pulse 78   Temp 97.7 F (36.5 C) (Oral)   Resp 18   Ht 6' (1.829 m)   Wt 83.9 kg   SpO2 99%   BMI 25.09 kg/m   Visual Acuity Right Eye Distance:   Left Eye Distance:   Bilateral Distance:    Right Eye Near:   Left Eye Near:    Bilateral Near:     Physical Exam Vitals signs and nursing note reviewed.  Constitutional:      General: He is not in acute distress.    Appearance: He is not toxic-appearing or diaphoretic.  Cardiovascular:     Rate and Rhythm: Normal rate and regular rhythm.     Pulses: Normal pulses.     Heart sounds: Normal heart sounds.  Pulmonary:     Effort: Pulmonary effort is normal. No respiratory distress.     Breath sounds: Normal breath sounds. No stridor. No wheezing, rhonchi or  rales.  Chest:     Chest wall: Tenderness present.  Neurological:     Mental Status: He is alert.      UC Treatments / Results  Labs (all labs ordered are listed, but only abnormal results are displayed) Labs Reviewed  TROPONIN I (HIGH SENSITIVITY)    EKG   Radiology Dg Chest 2 View  Result Date: 12/18/2018 CLINICAL DATA:  38 year old male with acute LEFT chest pain. EXAM: CHEST - 2 VIEW COMPARISON:  02/02/2012 FINDINGS: A moderate LEFT pneumothorax is noted with apical and basilar components. There is no evidence of mediastinal shift. No airspace disease, consolidation, mass or RIGHT pneumothorax noted. There may be a trace LEFT pleural effusion present. No acute bony abnormalities are identified. IMPRESSION: Moderate LEFT pneumothorax without mediastinal shift. Critical Value/emergent results were called by telephone at the time of interpretation on 12/18/2018 at 12:02 pm to Dr. Norval Gable , who verbally acknowledged these results. Electronically Signed   By: Margarette Canada M.D.   On: 12/18/2018 12:02    Procedures ED EKG  Date/Time: 12/18/2018 12:57 PM Performed by: Norval Gable, MD Authorized by: Norval Gable, MD   ECG reviewed by ED Physician in the absence of a cardiologist: yes   Previous ECG:    Previous ECG:  Unavailable Interpretation:    Interpretation: normal   Rate:    ECG rate assessment: normal   Rhythm:    Rhythm: sinus rhythm   Ectopy:    Ectopy: none   QRS:    QRS axis:  Normal Conduction:    Conduction: normal   ST segments:    ST segments:  Normal T waves:    T waves: normal     (including critical care time)  Medications Ordered in UC Medications  ketorolac (TORADOL) injection 60 mg (60 mg Intramuscular Given 12/18/18 1136)    Initial Impression / Assessment and Plan / UC Course  I have reviewed the triage vital signs and the nursing notes.  Pertinent labs & imaging results that were available during my care of the patient were  reviewed by me and considered in my medical decision making (see chart for details).      Final Clinical Impressions(s) / UC Diagnoses   Final diagnoses:  Spontaneous pneumothorax  (left)  ED Prescriptions    None  1. x-ray results and diagnosis reviewed with patient; recommend patient go to Emergency Department by EMS for further evaluation and management; patient in stable condition; IV saline lock; [facility].   Controlled Substance Prescriptions [place] Controlled Substance Registry consulted? Not Applicable   Payton Mccallumonty, Honorio Devol, MD 12/18/18 [phone]

## 2018-12-18 NOTE — ED Notes (Signed)
Wife updated on pt condition with pt consent.

## 2018-12-18 NOTE — Progress Notes (Signed)
No output from Chest tube at this time.

## 2018-12-18 NOTE — ED Provider Notes (Signed)
[facility] Emergency Department Provider Note    First MD Initiated Contact with Patient 12/18/18 1307     (approximate)  I have reviewed the triage vital signs and the nursing notes.   HISTORY  Chief Complaint Chest Injury    HPI Joe Guerra is a 38 y.o. male chief complaint of 2 days of progressively worsening left-sided chest pain as well as shortness of breath.  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  Denies any trauma or injury.  Is a smoker denies any history of asthma.  No history of pneumothorax previously.  Went to urgent care today due to the chest pain was found to have a spontaneous pneumothorax on the left.  Was sent to the ER for further evaluation.  He arrives hemodynamically stable with no hypoxia.    Past Medical History:  Diagnosis Date  . Migraine    Family History  Problem Relation Age of Onset  . Heart disease Father   . Thyroid cancer Mother   . Prostate cancer Paternal Grandfather   . Bladder Cancer Neg Hx   . Kidney cancer Neg Hx    Past Surgical History:  Procedure Laterality Date  . WISDOM TOOTH EXTRACTION     Patient Active Problem List   Diagnosis Date Noted  . Chronic bilateral low back pain without sciatica 01/16/2016  . Tobacco abuse 01/16/2016  . Headache 09/19/2014  . Intractable episodic cluster headache 09/19/2014      Prior to Admission medications   Medication Sig Start Date End Date Taking? Authorizing Provider  Magnesium 500 MG TABS Take 500 mg by mouth at bedtime.   Yes [provider]  nortriptyline (PAMELOR) 10 MG capsule Take 10 mg by mouth at bedtime.  07/31/18  Yes [provider]  vitamin C (ASCORBIC ACID) 250 MG tablet Take 250 mg by mouth daily.   Yes [provider]  SUMAtriptan (IMITREX) 50 MG tablet Take 1 tablet (50 mg total) by mouth once as needed for headache (cluster headache). May repeat in 2 hours if headache persists or recurs. 05/25/18 05/26/19   Sharyn CreamerQuale, Mark, MD    Allergies Influenza vaccines    Social History Social History   Tobacco Use  . Smoking status: Current Every Day Smoker    Packs/day: 0.50    Types: Cigarettes  . Smokeless tobacco: Never Used  Substance Use Topics  . Alcohol use: No  . Drug use: No    Review of Systems Patient denies headaches, rhinorrhea, blurry vision, numbness, shortness of breath, chest pain, edema, cough, abdominal pain, nausea, vomiting, diarrhea, dysuria, fevers, rashes or hallucinations unless otherwise stated above in HPI. ____________________________________________   PHYSICAL EXAM:  VITAL SIGNS: Vitals:   12/18/18 1400 12/18/18 1430  BP: (!) 143/95 (!) 158/95  Pulse: 74   Resp: (!) 22 20  Temp:    SpO2: 100%     Constitutional: Alert and oriented.  Eyes: Conjunctivae are normal.  Head: Atraumatic. Nose: No congestion/rhinnorhea. Mouth/Throat: Mucous membranes are moist.   Neck: No stridor. Painless ROM.  Cardiovascular: Normal rate, regular rhythm. Grossly normal heart sounds.  Good peripheral circulation. Respiratory: Normal respiratory effort.  No retractions. Lungs CTAB. Gastrointestinal: Soft and nontender. No distention. No abdominal bruits. No CVA tenderness. Genitourinary:  Musculoskeletal: No lower extremity tenderness nor edema.  No joint effusions. Neurologic:  Normal speech and language. No gross focal neurologic deficits are appreciated. No facial droop Skin:  Skin is warm, dry and intact. No rash noted.  Psychiatric: Mood and affect are normal. Speech and behavior are normal.  ____________________________________________   LABS (all labs ordered are listed, but only abnormal results are displayed)  Results for orders placed or performed during the hospital encounter of 12/18/18 (from the past 24 hour(s))  CBC with Differential/Platelet     Status: Abnormal   Collection Time: 12/18/18  1:00 PM  Result Value Ref Range   WBC 14.1 (H) 4.0 - 10.5 K/uL    RBC 4.88 4.22 - 5.81 MIL/uL   Hemoglobin 15.1 13.0 - 17.0 g/dL   HCT 16.143.8 09.639.0 - 04.552.0 %   MCV 89.8 80.0 - 100.0 fL   MCH 30.9 26.0 - 34.0 pg   MCHC 34.5 30.0 - 36.0 g/dL   RDW 40.912.7 81.111.5 - 91.415.5 %   Platelets 285 150 - 400 K/uL   nRBC 0.0 0.0 - 0.2 %   Neutrophils Relative % 78 %   Neutro Abs 11.0 (H) 1.7 - 7.7 K/uL   Lymphocytes Relative 13 %   Lymphs Abs 1.8 0.7 - 4.0 K/uL   Monocytes Relative 7 %   Monocytes Absolute 1.0 0.1 - 1.0 K/uL   Eosinophils Relative 1 %   Eosinophils Absolute 0.2 0.0 - 0.5 K/uL   Basophils Relative 1 %   Basophils Absolute 0.1 0.0 - 0.1 K/uL   Immature Granulocytes 0 %   Abs Immature Granulocytes 0.06 0.00 - 0.07 K/uL  Comprehensive metabolic panel     Status: None   Collection Time: 12/18/18  1:00 PM  Result Value Ref Range   Sodium 139 135 - 145 mmol/L   Potassium 4.0 3.5 - 5.1 mmol/L   Chloride 107 98 - 111 mmol/L   CO2 23 22 - 32 mmol/L   Glucose, Bld 90 70 - 99 mg/dL   BUN 8 6 - 20 mg/dL   Creatinine, Ser 7.820.83 0.61 - 1.24 mg/dL   Calcium 9.6 8.9 - 95.610.3 mg/dL   Total Protein 7.4 6.5 - 8.1 g/dL   Albumin 4.3 3.5 - 5.0 g/dL   AST 16 15 - 41 U/L   ALT 16 0 - 44 U/L   Alkaline Phosphatase 50 38 - 126 U/L   Total Bilirubin 0.6 0.3 - 1.2 mg/dL   GFR calc non Af Amer >60 >60 mL/min   GFR calc Af Amer >60 >60 mL/min   Anion gap 9 5 - 15  SARS Coronavirus 2 (CEPHEID- Performed in [facility] hospital lab), Hosp Order     Status: None   Collection Time: 12/18/18  1:08 PM   Specimen: Nasopharyngeal Swab  Result Value Ref Range   SARS Coronavirus 2 NEGATIVE NEGATIVE   ____________________________________________  EKG My review and personal interpretation at Time: 13:08   Indication: cp  Rate: 75  Rhythm: sinus Axis: normal Other: normal intervals, no stemi ____________________________________________  RADIOLOGY  I personally reviewed all radiographic images ordered to evaluate for the above acute complaints and reviewed radiology reports  and findings.  These findings were personally discussed with the patient.  Please see medical record for radiology report.  ____________________________________________   PROCEDURES  Procedure(s) performed:  CHEST TUBE INSERTION  Date/Time: 12/18/2018 2:50 PM Performed by: Willy Eddyobinson, Jerzy Roepke, MD Authorized by: Willy Eddyobinson, Chelcie Estorga, MD   Consent:    Consent obtained:  Verbal   Consent given by:  Patient   Risks discussed:  Bleeding, damage to surrounding structures, infection, incomplete drainage, nerve damage and pain   Alternatives discussed:  No treatment, delayed treatment, observation and alternative treatment  Universal protocol:    Procedure explained and questions answered to patient or proxy's satisfaction: yes     Test results available and properly labeled: yes     Imaging studies available: yes     Site/side marked: yes     Immediately prior to procedure a time out was called: yes   Pre-procedure details:    Skin preparation:  ChloraPrep Sedation:    Sedation type:  Anxiolysis Anesthesia (see MAR for exact dosages):    Anesthesia method:  Local infiltration   Local anesthetic:  Bupivacaine 0.5% w/o epi Procedure details:    Placement location:  L lateral   Scalpel size:  11   Tube size (Fr):  12   Dissection instrument: seldinger technique.   Ultrasound guidance: no     Tension pneumothorax: no     Tube connected to:  Water seal   Drainage characteristics:  Air only   Suture material:  2-0 silk   Dressing:  4x4 sterile gauze and Xeroform gauze Post-procedure details:    Post-insertion x-ray findings: tube in good position     Patient tolerance of procedure:  Tolerated well, no immediate complications .Critical Care Performed by: Willy Eddyobinson, Khrista Braun, MD Authorized by: Willy Eddyobinson, Cherrell Maybee, MD   Critical care provider statement:    Critical care time (minutes):  30   Critical care time was exclusive of:  Separately billable procedures and treating other patients    Critical care was necessary to treat or prevent imminent or life-threatening deterioration of the following conditions:  Respiratory failure   Critical care was time spent personally by me on the following activities:  Development of treatment plan with patient or surrogate, discussions with consultants, evaluation of patient's response to treatment, examination of patient, obtaining history from patient or surrogate, ordering and performing treatments and interventions, ordering and review of laboratory studies, ordering and review of radiographic studies, pulse oximetry, re-evaluation of patient's condition and review of old charts      Critical Care performed: yes ____________________________________________   INITIAL IMPRESSION / ASSESSMENT AND PLAN / ED COURSE  Pertinent labs & imaging results that were available during my care of the patient were reviewed by me and considered in my medical decision making (see chart for details).   DDX: Asthma, copd, CHF, pna, ptx, malignancy, Pe, anemia   Joe Guerra is a 38 y.o. who presents to the ED with shortness of breath chest discomfort and evidence of pneumothorax from urgent care.  He is protecting his airway currently.  Placed on supplemental oxygen.  Discussed my recommendation for decompression of pneumothorax with chest tube.  Patient consents to procedure.  Clinical Course as of Dec 17 1449  Sun Dec 18, 2018  1447 Post chest tube x-ray shows good position with decompressed pneumothorax.  Patient feels well.  Discussed case with Dr. Maia Planintron who kindly agrees to admit patient for further management.   [PR]    Clinical Course User Index [PR] Willy Eddyobinson, Fantashia Shupert, MD     Have discussed with the patient and available family all diagnostics and treatments performed thus far and all questions were answered to the best of my ability. The patient demonstrates understanding and agreement with plan.  The patient was evaluated in  Emergency Department today for the symptoms described in the history of present illness. He/she was evaluated in the context of the global COVID-19 pandemic, which necessitated consideration that the patient might be at risk for infection with the SARS-CoV-2 virus that causes COVID-19. Institutional protocols and  algorithms that pertain to the evaluation of patients at risk for COVID-19 are in a state of rapid change based on information released by regulatory bodies including the CDC and federal and state organizations. These policies and algorithms were followed during the patient's care in the ED.  As part of my medical decision making, I reviewed the following data within the [place] notes reviewed and incorporated, Labs reviewed, notes from prior ED visits and [place] Controlled Substance Database   ____________________________________________   FINAL CLINICAL IMPRESSION(S) / ED DIAGNOSES  Final diagnoses:  Spontaneous pneumothorax  Chest pain, unspecified type      NEW MEDICATIONS STARTED DURING THIS VISIT:  New Prescriptions   No medications on file     Note:  This document was prepared using Dragon voice recognition software and may include unintentional dictation errors.    Merlyn Lot, MD 12/18/18 1451

## 2018-12-19 ENCOUNTER — Inpatient Hospital Stay: Payer: BC Managed Care – PPO

## 2018-12-19 DIAGNOSIS — J9383 Other pneumothorax: Principal | ICD-10-CM

## 2018-12-19 MED ORDER — HYDROCODONE-ACETAMINOPHEN 5-325 MG PO TABS
1.0000 | ORAL_TABLET | ORAL | Status: DC | PRN
  Administered 2018-12-19 – 2018-12-20 (×5): 2 via ORAL
  Filled 2018-12-19 (×5): qty 2

## 2018-12-19 NOTE — Progress Notes (Signed)
  Patient ID: Joe Guerra, male   DOB: 27-Dec-1980, 38 y.o.   MRN: [id number]  HISTORY: Some discomfort at chest tube site.  Not short of breath.     Vitals:   12/18/18 1535 12/19/18 0448  BP: 137/83 130/77  Pulse: 61 64  Resp: 16 17  Temp: 97.9 F (36.6 C) 97.6 F (36.4 C)  SpO2: 100% 96%     EXAM:    Resp: Lungs are clear bilaterally.  No respiratory distress, normal effort. Heart:  Regular without murmurs Abd:  Abdomen is soft, non distended and non tender. No masses are palpable.  There is no rebound and no guarding.  Neurological: Alert and oriented to person, place, and time. Coordination normal.  Skin: Skin is warm and dry. No rash noted. No diaphoretic. No erythema. No pallor.  Psychiatric: Normal mood and affect. Normal behavior. Judgment and thought content normal.   No air leak.    ASSESSMENT: Spontaneous pneumothorax   PLAN:   Chest tube to water seal    Nestor Lewandowsky, MDPatient ID: Joe Guerra, male   DOB: 1980-12-02, 38 y.o.   MRN: [id number]

## 2018-12-20 ENCOUNTER — Telehealth: Payer: Self-pay

## 2018-12-20 ENCOUNTER — Inpatient Hospital Stay: Payer: BC Managed Care – PPO

## 2018-12-20 ENCOUNTER — Other Ambulatory Visit: Payer: Self-pay

## 2018-12-20 DIAGNOSIS — J9383 Other pneumothorax: Secondary | ICD-10-CM

## 2018-12-20 LAB — HIV ANTIBODY (ROUTINE TESTING W REFLEX): HIV Screen 4th Generation wRfx: NONREACTIVE

## 2018-12-20 MED ORDER — HYDROCODONE-ACETAMINOPHEN 5-325 MG PO TABS: 1.0000 | ORAL_TABLET | ORAL | 0 refills | Status: DC | PRN

## 2018-12-20 MED ORDER — ONDANSETRON 4 MG PO TBDP: 4.0000 mg | ORAL_TABLET | Freq: Three times a day (TID) | ORAL | Status: DC | PRN

## 2018-12-20 MED ORDER — ONDANSETRON HCL 4 MG/2ML IJ SOLN: 4.0000 mg | Freq: Four times a day (QID) | INTRAMUSCULAR | Status: DC | PRN

## 2018-12-20 MED ORDER — MUPIROCIN 2 % EX OINT: 1.0000 "application " | TOPICAL_OINTMENT | Freq: Two times a day (BID) | CUTANEOUS | 0 refills | Status: DC

## 2018-12-20 NOTE — Discharge Instructions (Signed)
Patient to followup with Dr. Genevive Bi in 2 weeks with a PA and Lateral CXRay prior to visit

## 2018-12-20 NOTE — Progress Notes (Signed)
Joe Guerra  A and O x 4. VSS. Pt tolerating diet well. No complaints of pain or nausea. IV removed intact, prescriptions given. Pt voiced understanding of discharge instructions with no further questions. Pt discharged via wheelchair.     Allergies as of 12/20/2018      Reactions   Influenza Vaccines Other (See Comments)   "sick"      Medication List    TAKE these medications   HYDROcodone-acetaminophen 5-325 MG tablet Commonly known as: NORCO/VICODIN Take 1-2 tablets by mouth every 4 (four) hours as needed for moderate pain.   Magnesium 500 MG Tabs Take 500 mg by mouth at bedtime. Notes to patient: 12/20/18   mupirocin ointment 2 % Commonly known as: BACTROBAN Place 1 application into the nose 2 (two) times daily. Notes to patient: 12/20/18   nortriptyline 10 MG capsule Commonly known as: PAMELOR Take 10 mg by mouth at bedtime. Notes to patient: 12/20/18   SUMAtriptan 50 MG tablet Commonly known as: Imitrex Take 1 tablet (50 mg total) by mouth once as needed for headache (cluster headache). May repeat in 2 hours if headache persists or recurs. Notes to patient: Resume   vitamin C 250 MG tablet Commonly known as: ASCORBIC ACID Take 250 mg by mouth daily. Notes to patient: 12/20/18       Vitals:   12/19/18 2102 12/20/18 0412  BP: (!) 148/97 125/86  Pulse: 72 69  Resp: 20 18  Temp: 97.8 F (36.6 C) 97.7 F (36.5 C)  SpO2: 96% 97%    Francesco Sor

## 2018-12-20 NOTE — Discharge Summary (Signed)
Physician Discharge Summary  Patient ID: Joe Guerra MRN: [id number] DOB/AGE: Mar 20, 1981 38 y.o.  Admit date: 12/18/2018 Discharge date: 12/20/2018   Discharge Diagnoses:  Active Problems:   Spontaneous pneumothorax   Procedures: Insertion of percutaneous chest tube  Hospital Course: Patient presented to ER with complaints of left chest pain and shortness of breath.  CXRay showed a moderate left pneumothorax.  He was treated with a small bore catheter with prompt reexpansion of his lung.  On hospital day #1 his chest tube was placed to water seal and after 24 hours, there was no air leak and CXRay showed no pneumothorax.  Chest tube removed and discharge instructions given.  He will stay off work this week and will resume normal work schedule next week.  Will see me in 2 weeks with a chest xray.  Disposition: Discharge disposition: 01-Home or Self Care       Discharge Instructions    Diet - low sodium heart healthy   Complete by: As directed    Increase activity slowly   Complete by: As directed      Allergies as of 12/20/2018      Reactions   Influenza Vaccines Other (See Comments)   "sick"      Medication List    TAKE these medications   HYDROcodone-acetaminophen 5-325 MG tablet Commonly known as: NORCO/VICODIN Take 1-2 tablets by mouth every 4 (four) hours as needed for moderate pain.   Magnesium 500 MG Tabs Take 500 mg by mouth at bedtime.   mupirocin ointment 2 % Commonly known as: BACTROBAN Place 1 application into the nose 2 (two) times daily.   nortriptyline 10 MG capsule Commonly known as: PAMELOR Take 10 mg by mouth at bedtime.   SUMAtriptan 50 MG tablet Commonly known as: Imitrex Take 1 tablet (50 mg total) by mouth once as needed for headache (cluster headache). May repeat in 2 hours if headache persists or recurs.   vitamin C 250 MG tablet Commonly known as: ASCORBIC ACID Take 250 mg by mouth daily.        Nestor Lewandowsky, MD

## 2018-12-20 NOTE — Final Progress Note (Signed)
  Patient ID: Joe Guerra, male   DOB: [birth year], 38 y.o.   MRN: [id number]  HISTORY: Did well overnight.  Not short of breath.  No air leak with deepest cough and Valsalva   Vitals:   12/19/18 2102 12/20/18 0412  BP: (!) 148/97 125/86  Pulse: 72 69  Resp: 20 18  Temp: 97.8 F (36.6 C) 97.7 F (36.5 C)  SpO2: 96% 97%     EXAM:    Resp: Lungs are clear bilaterally.  No respiratory distress, normal effort. Heart:  Regular without murmurs Abd:  Abdomen is soft, non distended and non tender. No masses are palpable.  There is no rebound and no guarding.  Neurological: Alert and oriented to person, place, and time. Coordination normal.  Skin: Skin is warm and dry. No rash noted. No diaphoretic. No erythema. No pallor.  Psychiatric: Normal mood and affect. Normal behavior. Judgment and thought content normal.   Independent review of CXRay shows no pneumothorax on water seal for last 24 hours.    ASSESSMENT: First occurrence of spontaneous pneumothorax   PLAN:   Chest tube removed without incident Will discharge to home DC instructions given See in 2 weeks with DSKAJ    Nestor Lewandowsky, MD

## 2018-12-22 NOTE — Telephone Encounter (Signed)
Error

## 2018-12-30 ENCOUNTER — Ambulatory Visit: Payer: Self-pay | Admitting: Cardiothoracic Surgery

## 2019-01-06 ENCOUNTER — Inpatient Hospital Stay: Payer: Self-pay | Admitting: Cardiothoracic Surgery

## 2019-01-09 ENCOUNTER — Encounter: Payer: Self-pay | Admitting: Intensive Care

## 2019-01-09 ENCOUNTER — Other Ambulatory Visit: Payer: Self-pay

## 2019-01-09 ENCOUNTER — Emergency Department: Payer: BC Managed Care – PPO

## 2019-01-09 ENCOUNTER — Emergency Department
Admission: EM | Admit: 2019-01-09 | Discharge: 2019-01-09 | Disposition: A | Discharge status: Home / Self Care | Payer: BC Managed Care – PPO | Attending: Emergency Medicine | Admitting: Emergency Medicine

## 2019-01-09 DIAGNOSIS — Z79899 Other long term (current) drug therapy: Secondary | ICD-10-CM | POA: Diagnosis not present

## 2019-01-09 DIAGNOSIS — R079 Chest pain, unspecified: Secondary | ICD-10-CM | POA: Diagnosis present

## 2019-01-09 DIAGNOSIS — F17210 Nicotine dependence, cigarettes, uncomplicated: Secondary | ICD-10-CM | POA: Insufficient documentation

## 2019-01-09 DIAGNOSIS — R0789 Other chest pain: Secondary | ICD-10-CM | POA: Diagnosis not present

## 2019-01-09 HISTORY — DX: Cluster headache syndrome, unspecified, not intractable: G44.009

## 2019-01-09 LAB — CBC
HCT: 43.5 % (ref 39.0–52.0)
Hemoglobin: 14.7 g/dL (ref 13.0–17.0)
MCH: 30.4 pg (ref 26.0–34.0)
MCHC: 33.8 g/dL (ref 30.0–36.0)
MCV: 89.9 fL (ref 80.0–100.0)
Platelets: 364 10*3/uL (ref 150–400)
RBC: 4.84 MIL/uL (ref 4.22–5.81)
RDW: 12.4 % (ref 11.5–15.5)
WBC: 5.4 10*3/uL (ref 4.0–10.5)
nRBC: 0 % (ref 0.0–0.2)

## 2019-01-09 LAB — BASIC METABOLIC PANEL
Anion gap: 10 (ref 5–15)
BUN: 8 mg/dL (ref 6–20)
CO2: 25 mmol/L (ref 22–32)
Calcium: 9.7 mg/dL (ref 8.9–10.3)
Chloride: 103 mmol/L (ref 98–111)
Creatinine, Ser: 0.94 mg/dL (ref 0.61–1.24)
GFR calc Af Amer: >60 mL/min (ref >60)
GFR calc non Af Amer: >60 mL/min (ref >60)
Glucose, Bld: 88 mg/dL (ref 70–99)
Potassium: 3.5 mmol/L (ref 3.5–5.1)
Sodium: 138 mmol/L (ref 135–145)

## 2019-01-09 LAB — TROPONIN I (HIGH SENSITIVITY): Troponin I (High Sensitivity): 3 ng/L (ref <18)

## 2019-01-09 MED ORDER — KETOROLAC TROMETHAMINE 30 MG/ML IJ SOLN
30.0000 mg | Freq: Once | INTRAMUSCULAR | Status: AC
  Administered 2019-01-09: 21:00:00 30 mg via INTRAMUSCULAR
  Filled 2019-01-09: qty 1

## 2019-01-09 MED ORDER — NAPROXEN 500 MG PO TABS: 500.0000 mg | ORAL_TABLET | Freq: Two times a day (BID) | ORAL | 2 refills | Status: DC

## 2019-01-09 NOTE — ED Triage Notes (Signed)
PAtient was seen on 7/26 and diagnosed with collapsed left lung. He reports he is now feeling the same symptoms on the right side chest/lung. Pain in right side of chest when he moves his right arm. Some SOB and CP

## 2019-01-09 NOTE — ED Notes (Signed)
No reaction to medication. Pt in NAD at time of discharge.

## 2019-01-09 NOTE — ED Provider Notes (Signed)
[facility] Emergency Department Provider Note   ____________________________________________    I have reviewed the triage vital signs and the nursing notes.   HISTORY  Chief Complaint Chest discomfort    HPI Joe Guerra is a 38 y.o. male who presents with complaints of a pulling sensation in his right chest wall.  He reports this feels somewhat similar to the discomfort he had in his left chest when he was recently diagnosed with a spontaneous pneumothorax and required a chest tube.  He is concerned that he has a pneumothorax in the right now.  He denies fevers or chills.  No cough.  No diaphoresis.  No radiation of pain.  He describes the pain in his right chest which is worse with moving his right arm.  He denies shortness of breath to me  Past Medical History:  Diagnosis Date  . Cluster headaches   . Migraine     Patient Active Problem List   Diagnosis Date Noted  . Spontaneous pneumothorax 12/18/2018  . Chronic bilateral low back pain without sciatica 01/16/2016  . Tobacco abuse 01/16/2016  . Headache 09/19/2014  . Intractable episodic cluster headache 09/19/2014    Past Surgical History:  Procedure Laterality Date  . WISDOM TOOTH EXTRACTION      Prior to Admission medications   Medication Sig Start Date End Date Taking? Authorizing Provider  HYDROcodone-acetaminophen (NORCO/VICODIN) 5-325 MG tablet Take 1-2 tablets by mouth every 4 (four) hours as needed for moderate pain. 12/20/18   Hulda Marinaks, Timothy, MD  Magnesium 500 MG TABS Take 500 mg by mouth at bedtime.    [provider]  mupirocin ointment (BACTROBAN) 2 % Place 1 application into the nose 2 (two) times daily. 12/20/18   Hulda Marinaks, Timothy, MD  naproxen (NAPROSYN) 500 MG tablet Take 1 tablet (500 mg total) by mouth 2 (two) times daily with a meal. 01/09/19   Jene EveryKinner, Bunny Kleist, MD  nortriptyline (PAMELOR) 10 MG capsule Take 10 mg by mouth at bedtime.  07/31/18   [provider]  SUMAtriptan (IMITREX) 50 MG tablet Take 1 tablet (50 mg total) by mouth once as needed for headache (cluster headache). May repeat in 2 hours if headache persists or recurs. 05/25/18 05/26/19  Sharyn CreamerQuale, Mark, MD  vitamin C (ASCORBIC ACID) 250 MG tablet Take 250 mg by mouth daily.    [provider]     Allergies Influenza vaccines  Family History  Problem Relation Age of Onset  . Heart disease Father   . Thyroid cancer Mother   . Prostate cancer Paternal Grandfather   . Bladder Cancer Neg Hx   . Kidney cancer Neg Hx     Social History Social History   Tobacco Use  . Smoking status: Current Every Day Smoker    Packs/day: 0.50    Types: Cigarettes  . Smokeless tobacco: Never Used  Substance Use Topics  . Alcohol use: No  . Drug use: No    Review of Systems  Constitutional: No fever/chills Eyes: No visual changes.  ENT: No sore throat. Cardiovascular: As above Respiratory: Denies shortness of breath. Gastrointestinal: No abdominal pain.  No nausea, no vomiting.   Genitourinary: Negative for dysuria. Musculoskeletal: Negative for back pain. Skin: Negative for rash. Neurological: Negative for headaches or weakness   ____________________________________________   PHYSICAL EXAM:  VITAL SIGNS: ED Triage Vitals  Enc Vitals Group     BP 01/09/19 1721 (!) 146/83     Pulse Rate 01/09/19 1721 89  Resp 01/09/19 1721 20     Temp 01/09/19 1721 98.6 F (37 C)     Temp Source 01/09/19 1721 Oral     SpO2 01/09/19 1721 99 %     Weight 01/09/19 1721 86.2 kg (190 lb)     Height 01/09/19 1721 1.829 m (6')     Head Circumference --      Peak Flow --      Pain Score 01/09/19 1741 6     Pain Loc --      Pain Edu? --      Excl. in [place]? --     Constitutional: Alert and oriented. No acute distress. Pleasant and interactive   Mouth/Throat: Mucous membranes are moist.   Neck:  Painless ROM Cardiovascular: Normal rate, regular rhythm. Grossly normal heart  sounds.  Good peripheral circulation.  Mild tenderness over the right pectoralis muscle worse with extension of the right arm against resistance Respiratory: Normal respiratory effort.  No retractions. Lungs CTAB. Gastrointestinal: Soft and nontender. No distention.  No CVA tenderness.  Musculoskeletal:  Warm and well perfused Neurologic:  Normal speech and language. No gross focal neurologic deficits are appreciated.  Skin:  Skin is warm, dry and intact.  No herpetic rash Psychiatric: Mood and affect are normal. Speech and behavior are normal.  ____________________________________________   LABS (all labs ordered are listed, but only abnormal results are displayed)  Labs Reviewed  BASIC METABOLIC PANEL  CBC  TROPONIN I (HIGH SENSITIVITY)  TROPONIN I (HIGH SENSITIVITY)   ____________________________________________  EKG  ED ECG REPORT I, Lavonia Drafts, the attending physician, personally viewed and interpreted this ECG.  Date: 01/09/2019  Rhythm: normal sinus rhythm QRS Axis: normal Intervals: normal ST/T Wave abnormalities: normal Narrative Interpretation: no evidence of acute ischemia  ____________________________________________  RADIOLOGY  Chest x-ray unremarkable ____________________________________________   PROCEDURES  Procedure(s) performed: No  Procedures   Critical Care performed: No ____________________________________________   INITIAL IMPRESSION / ASSESSMENT AND PLAN / ED COURSE  Pertinent labs & imaging results that were available during my care of the patient were reviewed by me and considered in my medical decision making (see chart for details).  Patient greatly reassured to hear abnormal chest x-ray and no pneumothorax noted.  Lab work is unremarkable, not consistent with ACS, normal troponin, normal EKG.  Given exam suspect chest wall pain.  Treated with IM Toradol, will start naproxen, outpatient follow-up, strict return precautions  discussed.    ____________________________________________   FINAL CLINICAL IMPRESSION(S) / ED DIAGNOSES  Final diagnoses:  Chest wall pain        Note:  This document was prepared using Dragon voice recognition software and may include unintentional dictation errors.   Lavonia Drafts, MD 01/09/19 2111

## 2019-01-09 NOTE — ED Notes (Signed)
MD at bedside. 

## 2019-01-13 ENCOUNTER — Ambulatory Visit (INDEPENDENT_AMBULATORY_CARE_PROVIDER_SITE_OTHER): Payer: BC Managed Care – PPO | Admitting: Cardiothoracic Surgery

## 2019-01-13 ENCOUNTER — Other Ambulatory Visit: Payer: Self-pay

## 2019-01-13 ENCOUNTER — Encounter: Payer: Self-pay | Admitting: Cardiothoracic Surgery

## 2019-01-13 VITALS — BP 128/83 | HR 93 | Temp 98.1°F | Resp 18 | Ht 72.0 in | Wt 185.6 lb

## 2019-01-13 DIAGNOSIS — J9383 Other pneumothorax: Secondary | ICD-10-CM

## 2019-01-13 NOTE — Patient Instructions (Signed)
Please call our office with any questions or concerns. 

## 2019-01-13 NOTE — Progress Notes (Signed)
  Patient ID: Joe Guerra, male   DOB: [birth year], 38 y.o.   MRN: [id number]  HISTORY: He returns today in follow-up.  He had previously undergone chest tube insertion for a spontaneous left-sided pneumothorax.  Couple days ago he began experiencing similar type pains on the right and presented to the emergency department.  A chest x-ray obtained did not reveal any evidence of pneumothorax.  His symptoms eventually resolved and he comes in today for routine follow-up.  He does continue to smoke at least half pack cigarettes a day.  His symptoms of the right-sided chest pain are now completely resolved   Vitals:   01/13/19 1151  BP: 128/83  Pulse: 93  Resp: 18  Temp: 98.1 F (36.7 C)  SpO2: 97%     EXAM:    Resp: Lungs are clear bilaterally.  No respiratory distress, normal effort. Heart:  Regular without murmurs Abd:  Abdomen is soft, non distended and non tender. No masses are palpable.  There is no rebound and no guarding.  Neurological: Alert and oriented to person, place, and time. Coordination normal.  Skin: Skin is warm and dry. No rash noted. No diaphoretic. No erythema. No pallor.  Psychiatric: Normal mood and affect. Normal behavior. Judgment and thought content normal.    ASSESSMENT: Spontaneous left-sided pneumothorax   PLAN:   I have independently reviewed the patient's chest x-ray.  There is no evidence of pneumothorax.  I did counsel him regarding the possibility of a recurrent pneumothorax.  He understands and will seek medical attention should any problems develop.    Nestor Lewandowsky, MD

## 2019-01-27 ENCOUNTER — Other Ambulatory Visit: Payer: Self-pay

## 2019-01-27 ENCOUNTER — Encounter: Payer: Self-pay | Admitting: Emergency Medicine

## 2019-01-27 ENCOUNTER — Emergency Department: Payer: BC Managed Care – PPO

## 2019-01-27 ENCOUNTER — Emergency Department
Admission: EM | Admit: 2019-01-27 | Discharge: 2019-01-27 | Disposition: A | Discharge status: Home / Self Care | Payer: BC Managed Care – PPO | Attending: Emergency Medicine | Admitting: Emergency Medicine

## 2019-01-27 DIAGNOSIS — Z79899 Other long term (current) drug therapy: Secondary | ICD-10-CM | POA: Insufficient documentation

## 2019-01-27 DIAGNOSIS — F17210 Nicotine dependence, cigarettes, uncomplicated: Secondary | ICD-10-CM | POA: Diagnosis not present

## 2019-01-27 DIAGNOSIS — R091 Pleurisy: Secondary | ICD-10-CM | POA: Insufficient documentation

## 2019-01-27 DIAGNOSIS — R0789 Other chest pain: Secondary | ICD-10-CM | POA: Diagnosis present

## 2019-01-27 LAB — CBC
HCT: 41 % (ref 39.0–52.0)
Hemoglobin: 14.2 g/dL (ref 13.0–17.0)
MCH: 30.8 pg (ref 26.0–34.0)
MCHC: 34.6 g/dL (ref 30.0–36.0)
MCV: 88.9 fL (ref 80.0–100.0)
Platelets: 300 10*3/uL (ref 150–400)
RBC: 4.61 MIL/uL (ref 4.22–5.81)
RDW: 12.3 % (ref 11.5–15.5)
WBC: 9.7 10*3/uL (ref 4.0–10.5)
nRBC: 0 % (ref 0.0–0.2)

## 2019-01-27 LAB — BASIC METABOLIC PANEL
Anion gap: 10 (ref 5–15)
BUN: <5 mg/dL — ABNORMAL LOW (ref 6–20)
CO2: 21 mmol/L — ABNORMAL LOW (ref 22–32)
Calcium: 9.1 mg/dL (ref 8.9–10.3)
Chloride: 107 mmol/L (ref 98–111)
Creatinine, Ser: 0.87 mg/dL (ref 0.61–1.24)
GFR calc Af Amer: >60 mL/min (ref >60)
GFR calc non Af Amer: >60 mL/min (ref >60)
Glucose, Bld: 87 mg/dL (ref 70–99)
Potassium: 3.7 mmol/L (ref 3.5–5.1)
Sodium: 138 mmol/L (ref 135–145)

## 2019-01-27 LAB — TROPONIN I (HIGH SENSITIVITY): Troponin I (High Sensitivity): <2 ng/L (ref <18)

## 2019-01-27 MED ORDER — NAPROXEN 500 MG PO TABS
500.0000 mg | ORAL_TABLET | Freq: Once | ORAL | Status: AC
  Administered 2019-01-27: 500 mg via ORAL
  Filled 2019-01-27: qty 1

## 2019-01-27 NOTE — ED Triage Notes (Signed)
Pt arrives via [facility] with c/o chest pain on the right side. Pt was seen here x 6 weeks ago for a spontaneous pneumothorax and called EMS due aspirating some coffee yesterday. Pt has clear lung sounds throughout all lobes at this time and is in NAD.

## 2019-01-27 NOTE — ED Provider Notes (Signed)
[facility] Emergency Department Provider Note   ____________________________________________    I have reviewed the triage vital signs and the nursing notes.   HISTORY  Chief Complaint Chest Pain     HPI Joe Guerra is a 38 y.o. male who presents with complaints of right-sided chest discomfort.  Patient reports he has had pain in the area especially with inspiration since he choked on his coffee yesterday.  He reports that he inhaled his coffee accidentally and had a furious coughing fit, since then he has had pleuritic chest discomfort only on the right.  He has a history of a spontaneous pneumothorax.  He became concerned that this may have happened again so he has presented to the emergency department.  No history of PE or DVT.  No fevers or chills or cough.  Past Medical History:  Diagnosis Date  . Cluster headaches   . Migraine     Patient Active Problem List   Diagnosis Date Noted  . Spontaneous pneumothorax 12/18/2018  . Chronic bilateral low back pain without sciatica 01/16/2016  . Tobacco abuse 01/16/2016  . Headache 09/19/2014  . Intractable episodic cluster headache 09/19/2014    Past Surgical History:  Procedure Laterality Date  . WISDOM TOOTH EXTRACTION      Prior to Admission medications   Medication Sig Start Date End Date Taking? Authorizing Provider  Magnesium 500 MG TABS Take 500 mg by mouth at bedtime.    [provider]  mupirocin ointment (BACTROBAN) 2 % Place 1 application into the nose 2 (two) times daily. 12/20/18   Hulda Marinaks, Timothy, MD  naproxen (NAPROSYN) 500 MG tablet Take 1 tablet (500 mg total) by mouth 2 (two) times daily with a meal. 01/09/19   Jene EveryKinner, Kiren Mcisaac, MD  nortriptyline (PAMELOR) 10 MG capsule Take 10 mg by mouth at bedtime.  07/31/18   [provider]  SUMAtriptan (IMITREX) 50 MG tablet Take 1 tablet (50 mg total) by mouth once as needed for headache (cluster headache). May  repeat in 2 hours if headache persists or recurs. 05/25/18 05/26/19  Sharyn CreamerQuale, Mark, MD  vitamin C (ASCORBIC ACID) 250 MG tablet Take 250 mg by mouth daily.    [provider]     Allergies Influenza vaccines  Family History  Problem Relation Age of Onset  . Heart disease Father   . Thyroid cancer Mother   . Prostate cancer Paternal Grandfather   . Bladder Cancer Neg Hx   . Kidney cancer Neg Hx     Social History Social History   Tobacco Use  . Smoking status: Current Every Day Smoker    Packs/day: 0.50    Types: Cigarettes  . Smokeless tobacco: Never Used  Substance Use Topics  . Alcohol use: No  . Drug use: No    Review of Systems  Constitutional: No fever/chills Eyes: No visual changes.  ENT: No sore throat. Cardiovascular: Pleurisy as above Respiratory: No shortness of breath Gastrointestinal: No abdominal pain.  No nausea, no vomiting.   Genitourinary: Negative for dysuria. Musculoskeletal: Negative for back pain. Skin: Negative for rash. Neurological: Negative for headaches or weakness   ____________________________________________   PHYSICAL EXAM:  VITAL SIGNS: ED Triage Vitals  Enc Vitals Group     BP 01/27/19 2024 135/87     Pulse Rate 01/27/19 2024 87     Resp 01/27/19 2024 18     Temp 01/27/19 2024 98 F (36.7 C)     Temp Source 01/27/19 2024  Oral     SpO2 01/27/19 2024 100 %     Weight 01/27/19 2025 83.9 kg (185 lb)     Height 01/27/19 2025 1.829 m (6')     Head Circumference --      Peak Flow --      Pain Score 01/27/19 2025 6     Pain Loc --      Pain Edu? --      Excl. in [place]? --     Constitutional: Alert and oriented.  Eyes: Conjunctivae are normal.    Mouth/Throat: Mucous membranes are moist.   Neck:  Painless ROM Cardiovascular: Normal rate, regular rhythm. Grossly normal heart sounds.  Good peripheral circulation. Respiratory: Normal respiratory effort.  No retractions. Lungs CTAB. Gastrointestinal: Soft and nontender. No  distention.  No CVA tenderness.  Musculoskeletal: No lower extremity tenderness nor edema.  Warm and well perfused Neurologic:  Normal speech and language. No gross focal neurologic deficits are appreciated.  Skin:  Skin is warm, dry and intact. No rash noted. Psychiatric: Mood and affect are normal. Speech and behavior are normal.  ____________________________________________   LABS (all labs ordered are listed, but only abnormal results are displayed)  Labs Reviewed  BASIC METABOLIC PANEL - Abnormal; Notable for the following components:      Result Value   CO2 21 (*)    BUN <5 (*)    All other components within normal limits  CBC  TROPONIN I (HIGH SENSITIVITY)  TROPONIN I (HIGH SENSITIVITY)   ____________________________________________  EKG  ED ECG REPORT I, Lavonia Drafts, the attending physician, personally viewed and interpreted this ECG.  Date: 01/27/2019  Rhythm: normal sinus rhythm QRS Axis: normal Intervals: normal ST/T Wave abnormalities: normal Narrative Interpretation: no evidence of acute ischemia  ____________________________________________  RADIOLOGY  Chest x-ray normal no pneumothorax ____________________________________________   PROCEDURES  Procedure(s) performed: No  Procedures   Critical Care performed: No ____________________________________________   INITIAL IMPRESSION / ASSESSMENT AND PLAN / ED COURSE  Pertinent labs & imaging results that were available during my care of the patient were reviewed by me and considered in my medical decision making (see chart for details).  Patient presents with pleuritic chest discomfort as described above, suspect this is related to accidental coffee inhalation given the time course.  Not consistent with PE.  No shortness of breath, chest x-ray negative for pneumothorax or pneumonia.  Will treat with NSAIDs, return precautions discussed    ____________________________________________   FINAL  CLINICAL IMPRESSION(S) / ED DIAGNOSES  Final diagnoses:  Pleurisy        Note:  This document was prepared using Dragon voice recognition software and may include unintentional dictation errors.   Lavonia Drafts, MD 01/27/19 [phone]

## 2019-01-27 NOTE — ED Notes (Signed)
Refer to triage note; Pt st he drank coffee yesterday morning and started severe coughing, right sided chest pain started at that time. Pt st "when I breath deep it hurts". Pt NAd noted upon assessment. EDp kinnier at bedside at this time.

## 2019-01-30 ENCOUNTER — Ambulatory Visit
Admission: EM | Admit: 2019-01-30 | Discharge: 2019-01-30 | Disposition: A | Discharge status: Home / Self Care | Payer: BC Managed Care – PPO | Attending: Family Medicine | Admitting: Family Medicine

## 2019-01-30 ENCOUNTER — Ambulatory Visit (INDEPENDENT_AMBULATORY_CARE_PROVIDER_SITE_OTHER): Payer: BC Managed Care – PPO

## 2019-01-30 ENCOUNTER — Encounter: Payer: Self-pay | Admitting: Emergency Medicine

## 2019-01-30 ENCOUNTER — Other Ambulatory Visit: Payer: Self-pay

## 2019-01-30 DIAGNOSIS — R0781 Pleurodynia: Secondary | ICD-10-CM | POA: Diagnosis not present

## 2019-01-30 MED ORDER — HYDROCODONE-ACETAMINOPHEN 5-325 MG PO TABS: 1.0000 | ORAL_TABLET | Freq: Three times a day (TID) | ORAL | 0 refills | Status: DC | PRN

## 2019-01-30 MED ORDER — TIZANIDINE HCL 4 MG PO TABS: 4.0000 mg | ORAL_TABLET | Freq: Three times a day (TID) | ORAL | 0 refills | Status: AC | PRN

## 2019-01-30 NOTE — ED Triage Notes (Signed)
Patient states he was diagnosed with Pleurisy last Friday.  Patient states he is still having bad pain on the right side of his chest when he coughs

## 2019-01-30 NOTE — ED Provider Notes (Signed)
[facility]    CSN: [id number] Arrival date & time: 01/30/19  1026      History   Chief Complaint Chief Complaint  Patient presents with  . Cough    HPI  38 year old male presents with right-sided chest/rib pain.  Patient reports that this started on Wednesday.  He was seen in the ER on Friday.  Diagnosed with pleurisy.  Given anti-inflammatories and discharged home.  Patient reports that he continues to have significant pain.  Pain is located on the lateral aspect of the right side of his chest.  Worse with palpation, deep inspiration, and cough.  No fever.  No shortness of breath.  States that his pain is currently 6/10 in severity.  He has had no improvement with anti-inflammatories.  Patient has history of spontaneous pneumothorax.  He continues to be concerned about this.  No other associated symptoms.  No other complaints.  Hx reviewed as below. Past Medical History:  Diagnosis Date  . Cluster headaches   . Migraine    Patient Active Problem List   Diagnosis Date Noted  . Spontaneous pneumothorax 12/18/2018  . Chronic bilateral low back pain without sciatica 01/16/2016  . Tobacco abuse 01/16/2016  . Headache 09/19/2014  . Intractable episodic cluster headache 09/19/2014   Past Surgical History:  Procedure Laterality Date  . WISDOM TOOTH EXTRACTION      Home Medications    Prior to Admission medications   Medication Sig Start Date End Date Taking? Authorizing Provider  Magnesium 500 MG TABS Take 500 mg by mouth at bedtime.   Yes [provider]  nortriptyline (PAMELOR) 10 MG capsule Take 10 mg by mouth at bedtime.  07/31/18  Yes [provider]  SUMAtriptan (IMITREX) 50 MG tablet Take 1 tablet (50 mg total) by mouth once as needed for headache (cluster headache). May repeat in 2 hours if headache persists or recurs. 05/25/18 05/26/19 Yes Sharyn Creamer, MD  vitamin C (ASCORBIC ACID) 250 MG tablet Take 250 mg by mouth daily.   Yes [provider]  HYDROcodone-acetaminophen (NORCO/VICODIN) 5-325 MG tablet Take 1 tablet by mouth every 8 (eight) hours as needed for moderate pain or severe pain. 01/30/19   Tommie Sams, DO  tiZANidine (ZANAFLEX) 4 MG tablet Take 1 tablet (4 mg total) by mouth every 8 (eight) hours as needed for muscle spasms. 01/30/19   Tommie Sams, DO    Family History Family History  Problem Relation Age of Onset  . Heart disease Father   . Thyroid cancer Mother   . Prostate cancer Paternal Grandfather   . Bladder Cancer Neg Hx   . Kidney cancer Neg Hx     Social History Social History   Tobacco Use  . Smoking status: Current Every Day Smoker    Packs/day: 0.50    Types: Cigarettes  . Smokeless tobacco: Never Used  Substance Use Topics  . Alcohol use: No  . Drug use: No     Allergies   Influenza vaccines   Review of Systems Review of Systems  Constitutional: Negative.   Cardiovascular: Positive for chest pain.   Physical Exam Triage Vital Signs ED Triage Vitals  Enc Vitals Group     BP 01/30/19 1036 (!) 131/91     Pulse Rate 01/30/19 1036 92     Resp 01/30/19 1036 18     Temp 01/30/19 1036 97.9 F (36.6 C)     Temp Source 01/30/19 1036 Oral     SpO2  01/30/19 1036 100 %     Weight 01/30/19 1039 185 lb (83.9 kg)     Height 01/30/19 1039 6' (1.829 m)     Head Circumference --      Peak Flow --      Pain Score 01/30/19 1039 6     Pain Loc --      Pain Edu? --      Excl. in [place]? --    Updated Vital Signs BP (!) 131/91 (BP Location: Left Arm)   Pulse 92   Temp 97.9 F (36.6 C) (Oral)   Resp 18   Ht 6' (1.829 m)   Wt 83.9 kg   SpO2 100%   BMI 25.09 kg/m   Visual Acuity Right Eye Distance:   Left Eye Distance:   Bilateral Distance:    Right Eye Near:   Left Eye Near:    Bilateral Near:     Physical Exam Vitals signs and nursing note reviewed.  Constitutional:      General: He is not in acute distress.    Appearance: Normal appearance. He is not  ill-appearing.  HENT:     Head: Normocephalic and atraumatic.  Eyes:     General:        Right eye: No discharge.        Left eye: No discharge.     Conjunctiva/sclera: Conjunctivae normal.  Cardiovascular:     Rate and Rhythm: Normal rate and regular rhythm.     Heart sounds: Murmur present.  Pulmonary:     Effort: Pulmonary effort is normal.     Breath sounds: Normal breath sounds. No wheezing, rhonchi or rales.  Neurological:     Mental Status: He is alert and oriented to person, place, and time.  Psychiatric:        Mood and Affect: Mood normal.        Behavior: Behavior normal.    UC Treatments / Results  Labs (all labs ordered are listed, but only abnormal results are displayed) Labs Reviewed - No data to display  EKG   Radiology Dg Chest 2 View  Result Date: 01/30/2019 CLINICAL DATA:  Right-sided pleuritic chest pain and cough for 2 days. Smoker. EXAM: CHEST - 2 VIEW COMPARISON:  01/27/2019 FINDINGS: The heart size and mediastinal contours are within normal limits. Both lungs are clear. Previously seen small opacity in the right cardiophrenic angle is no longer visualized. No evidence of pneumothorax or pleural effusion. The visualized skeletal structures are unremarkable. IMPRESSION: No active cardiopulmonary disease. Electronically Signed   By: Danae OrleansJohn A Stahl M.D.   On: 01/30/2019 11:15    Procedures Procedures (including critical care time)  Medications Ordered in UC Medications - No data to display  Initial Impression / Assessment and Plan / UC Course  I have reviewed the triage vital signs and the nursing notes.  Pertinent labs & imaging results that were available during my care of the patient were reviewed by me and considered in my medical decision making (see chart for details).    38 year old male presents with pleuritic pain. Chest xray negative.  Zanaflex as directed.  PRN Vicodin as needed.  [place] controlled substance database reviewed.  No  concerns for abuse at this time.  Final Clinical Impressions(s) / UC Diagnoses   Final diagnoses:  Pleuritic pain     Discharge Instructions     Rest.  Medication as prescribed.  Take care  Dr. Adriana Simasook    ED Prescriptions  Medication Sig Dispense Auth. Provider   tiZANidine (ZANAFLEX) 4 MG tablet Take 1 tablet (4 mg total) by mouth every 8 (eight) hours as needed for muscle spasms. 30 tablet Jaquon Gingerich G, DO   HYDROcodone-acetaminophen (NORCO/VICODIN) 5-325 MG tablet Take 1 tablet by mouth every 8 (eight) hours as needed for moderate pain or severe pain. 10 tablet Coral Spikes, DO     Controlled Substance Prescriptions [place] Controlled Substance Registry consulted? Yes, I have consulted the [place] Controlled Substances Registry for this patient, and feel the risk/benefit ratio today is favorable for proceeding with this prescription for a controlled substance.   Coral Spikes, [place] 01/30/19 1217

## 2019-01-30 NOTE — Discharge Instructions (Signed)
Rest  Medication as prescribed.  Take care  Dr. Rashad Obeid  

## 2020-08-06 ENCOUNTER — Other Ambulatory Visit: Payer: Self-pay

## 2020-08-06 ENCOUNTER — Encounter: Payer: Self-pay | Admitting: Emergency Medicine

## 2020-08-06 ENCOUNTER — Ambulatory Visit
Admission: EM | Admit: 2020-08-06 | Discharge: 2020-08-06 | Disposition: A | Discharge status: Home / Self Care | Payer: BC Managed Care – PPO | Attending: Family Medicine | Admitting: Family Medicine

## 2020-08-06 DIAGNOSIS — S31119A Laceration without foreign body of abdominal wall, unspecified quadrant without penetration into peritoneal cavity, initial encounter: Secondary | ICD-10-CM | POA: Diagnosis not present

## 2020-08-06 DIAGNOSIS — Z23 Encounter for immunization: Secondary | ICD-10-CM

## 2020-08-06 MED ORDER — TETANUS-DIPHTH-ACELL PERTUSSIS 5-2.5-18.5 LF-MCG/0.5 IM SUSY
0.5000 mL | PREFILLED_SYRINGE | Freq: Once | INTRAMUSCULAR | Status: AC
  Administered 2020-08-06: 0.5 mL via INTRAMUSCULAR

## 2020-08-06 MED ORDER — AMOXICILLIN-POT CLAVULANATE 875-125 MG PO TABS: 1.0000 | ORAL_TABLET | Freq: Two times a day (BID) | ORAL | 0 refills | Status: AC

## 2020-08-06 NOTE — Discharge Instructions (Signed)
LACERATION: Follow up in 7 days for suture removal or sooner if there are any signs of infection such as fever, increased pain/redness/soft tissue swelling, or drainage from the laceration site.  Take antibiotics to help prevent infection. May follow up with PCP to have sutures removed or urgent care sooner if condition changes or signs of infection. Tylenol or Motrin for pain relief.

## 2020-08-06 NOTE — ED Provider Notes (Signed)
[facility]    CSN: [id number] Arrival date & time: 08/06/20  0815      History   Chief Complaint Chief Complaint  Patient presents with  . Puncture Wound    HPI Joe Guerra is a 40 y.o. male presenting for external laceration of the lower abdomen that happened about 4 hours ago.  Patient says that he was trying to open trailer doors that were sealed with a plastic seal.  He says that he was using his knife with it pointing towards him and accidentally stabbed himself in the lower right abdomen.  Patient says he immediately pulled a knife away.  Denies ever being stuck in the abdomen.  He says he does not know how deep the knife went in.  The knife blade is about 4 inches but the whole thing did not go in.  He admits to pain in this area, but denies any pain throughout the abdomen.  Minimal bleeding.  No nausea/vomiting.  No blood in the urine or stool.  Has taken over-the-counter ibuprofen with some improvement in the pain.  Unsure of last tetanus immunization.  Patient denies any other injuries and states this was completely accidental and did not involve any other people.  He has no other concerns.  HPI  Past Medical History:  Diagnosis Date  . Cluster headaches   . Migraine     Patient Active Problem List   Diagnosis Date Noted  . Spontaneous pneumothorax 12/18/2018  . Chronic bilateral low back pain without sciatica 01/16/2016  . Tobacco abuse 01/16/2016  . Headache 09/19/2014  . Intractable episodic cluster headache 09/19/2014    Past Surgical History:  Procedure Laterality Date  . WISDOM TOOTH EXTRACTION         Home Medications    Prior to Admission medications   Medication Sig Start Date End Date Taking? Authorizing Provider  amoxicillin-clavulanate (AUGMENTIN) 875-125 MG tablet Take 1 tablet by mouth every 12 (twelve) hours for 7 days. 08/06/20 08/13/20 Yes Shirlee Latch, PA-C  HYDROcodone-acetaminophen (NORCO/VICODIN) 5-325 MG  tablet Take 1 tablet by mouth every 8 (eight) hours as needed for moderate pain or severe pain. 01/30/19   Tommie Sams, DO  Magnesium 500 MG TABS Take 500 mg by mouth at bedtime.    [provider]  nortriptyline (PAMELOR) 10 MG capsule Take 10 mg by mouth at bedtime.  07/31/18   [provider]  SUMAtriptan (IMITREX) 50 MG tablet Take 1 tablet (50 mg total) by mouth once as needed for headache (cluster headache). May repeat in 2 hours if headache persists or recurs. 05/25/18 05/26/19  Sharyn Creamer, MD  tiZANidine (ZANAFLEX) 4 MG tablet Take 1 tablet (4 mg total) by mouth every 8 (eight) hours as needed for muscle spasms. 01/30/19   Tommie Sams, DO  vitamin C (ASCORBIC ACID) 250 MG tablet Take 250 mg by mouth daily.    [provider]    Family History Family History  Problem Relation Age of Onset  . Heart disease Father   . Thyroid cancer Mother   . Prostate cancer Paternal Grandfather   . Bladder Cancer Neg Hx   . Kidney cancer Neg Hx     Social History Social History   Tobacco Use  . Smoking status: Current Every Day Smoker    Packs/day: 0.50    Types: Cigarettes  . Smokeless tobacco: Never Used  Vaping Use  . Vaping Use: Never used  Substance Use Topics  .  Alcohol use: No  . Drug use: No     Allergies   Influenza vaccines   Review of Systems Review of Systems  Constitutional: Negative for fatigue.  Gastrointestinal: Positive for abdominal pain. Negative for nausea and vomiting.  Genitourinary: Negative for flank pain and hematuria.  Skin: Positive for wound.  Neurological: Negative for dizziness, syncope and weakness.  Psychiatric/Behavioral: Positive for self-injury (accidental).     Physical Exam Triage Vital Signs ED Triage Vitals  Enc Vitals Group     BP 08/06/20 0833 (!) 150/95     Pulse Rate 08/06/20 0833 84     Resp 08/06/20 0833 18     Temp 08/06/20 0833 97.8 F (36.6 C)     Temp Source 08/06/20 0833 Oral     SpO2 08/06/20  0833 100 %     Weight 08/06/20 0830 184 lb 15.5 oz (83.9 kg)     Height 08/06/20 0830 6' (1.829 m)     Head Circumference --      Peak Flow --      Pain Score 08/06/20 0830 7     Pain Loc --      Pain Edu? --      Excl. in GC? --    No data found.  Updated Vital Signs BP (!) 150/95 (BP Location: Left Arm)   Pulse 84   Temp 97.8 F (36.6 C) (Oral)   Resp 18   Ht 6' (1.829 m)   Wt 184 lb 15.5 oz (83.9 kg)   SpO2 100%   BMI 25.09 kg/m       Physical Exam Vitals and nursing note reviewed.  Constitutional:      General: He is not in acute distress.    Appearance: Normal appearance. He is well-developed. He is not ill-appearing.  HENT:     Head: Normocephalic and atraumatic.  Eyes:     General: No scleral icterus.    Conjunctiva/sclera: Conjunctivae normal.  Cardiovascular:     Rate and Rhythm: Normal rate and regular rhythm.     Heart sounds: Normal heart sounds.  Pulmonary:     Effort: Pulmonary effort is normal. No respiratory distress.     Breath sounds: Normal breath sounds.  Abdominal:     Palpations: Abdomen is soft.     Tenderness: There is abdominal tenderness (localized to area of laceration).  Musculoskeletal:     Cervical back: Neck supple.  Skin:    General: Skin is warm and dry.     Findings: Lesion (1.5 cm superficial laceration lower right abdomen. Can see depth of wound which is about 3-4 mm. Minimal bleeding) present.  Neurological:     General: No focal deficit present.     Mental Status: He is alert. Mental status is at baseline.     Motor: No weakness.     Gait: Gait normal.  Psychiatric:        Mood and Affect: Mood normal.        Behavior: Behavior normal.        Thought Content: Thought content normal.            UC Treatments / Results  Labs (all labs ordered are listed, but only abnormal results are displayed) Labs Reviewed - No data to display  EKG   Radiology No results found.  Procedures Laceration  Repair  Date/Time: 08/06/2020 9:16 AM Performed by: Shirlee Latch, PA-C Authorized by: Shirlee Latch, PA-C   Consent:    Consent obtained:  Verbal   Consent given by:  Patient   Risks, benefits, and alternatives were discussed: yes     Risks discussed:  Infection, need for additional repair, pain, poor cosmetic result and poor wound healing   Alternatives discussed:  No treatment and delayed treatment Universal protocol:    Procedure explained and questions answered to patient or proxy's satisfaction: yes     Relevant documents present and verified: yes     Patient identity confirmed:  Verbally with patient and arm band Anesthesia:    Anesthesia method:  Local infiltration   Local anesthetic:  Lidocaine 1% WITH epi Laceration details:    Location:  Trunk   Trunk location:  RLQ abd   Length (cm):  1.5   Depth (mm):  3.5 Pre-procedure details:    Preparation:  Patient was prepped and draped in usual sterile fashion Exploration:    Hemostasis achieved with:  Direct pressure   Wound extent: areolar tissue violated     Contaminated: no   Treatment:    Area cleansed with:  Povidone-iodine   Amount of cleaning:  Standard   Irrigation solution:  Sterile saline   Irrigation method:  Syringe   Debridement:  None   Undermining:  None Skin repair:    Repair method:  Sutures   Suture size:  5-0   Suture technique:  Simple interrupted   Number of sutures:  3 Approximation:    Approximation:  Close Repair type:    Repair type:  Simple Post-procedure details:    Dressing:  Non-adherent dressing   Procedure completion:  Tolerated well, no immediate complications   (including critical care time)  Medications Ordered in UC Medications  Tdap (BOOSTRIX) injection 0.5 mL (0.5 mLs Intramuscular Given 08/06/20 0842)    Initial Impression / Assessment and Plan / UC Course  I have reviewed the triage vital signs and the nursing notes.  Pertinent labs & imaging results that were  available during my care of the patient were reviewed by me and considered in my medical decision making (see chart for details).   40 year old male presenting with laceration of the lower abdomen.  Laceration is superficial and able to see the depth of the wound.  Fatty tissue is violated but no deeper.  No contamination or foreign bodies noted.  Tenderness localized to area of wound.  No active bleeding currently.  Tetanus updated.  After wound thoroughly cleansed repaired laceration with 3 simple sutures.  Advised on wound care.  Treating with Augmentin to try to prevent any infection.  Advised to follow-up in 7 days for suture removal.  ED precautions for laceration and puncture wounds of abdomen reviewed with patient.  Work note provided for couple of days.  Final Clinical Impressions(s) / UC Diagnoses   Final diagnoses:  Laceration of abdomen without foreign body     Discharge Instructions     LACERATION: Follow up in 7 days for suture removal or sooner if there are any signs of infection such as fever, increased pain/redness/soft tissue swelling, or drainage from the laceration site.  Take antibiotics to help prevent infection. May follow up with PCP to have sutures removed or urgent care sooner if condition changes or signs of infection. Tylenol or Motrin for pain relief.     ED Prescriptions    Medication Sig Dispense Auth. Provider   amoxicillin-clavulanate (AUGMENTIN) 875-125 MG tablet Take 1 tablet by mouth every 12 (twelve) hours for 7 days. 14 tablet Shirlee Latch, PA-C  PDMP not reviewed this encounter.   Shirlee Latchaves, Ashanna Heinsohn B, PA-C 08/06/20 [phone]

## 2020-08-06 NOTE — ED Triage Notes (Signed)
Pt states that he was trying to open trailer doors that was sealed with a plastic seal and when he did he had the knife pointing toward himself and he hit his abdomen with the knife. He has a puncture/stab wound on his right lower abdomen. Pt states it feels like the knife went in deep. Pt also has scratch makes on his right hand and arm that appear fresh.

## 2020-08-15 ENCOUNTER — Other Ambulatory Visit: Payer: Self-pay

## 2020-08-15 ENCOUNTER — Ambulatory Visit
Admission: EM | Admit: 2020-08-15 | Discharge: 2020-08-15 | Disposition: A | Discharge status: Home / Self Care | Payer: BC Managed Care – PPO

## 2020-08-15 DIAGNOSIS — Z4802 Encounter for removal of sutures: Secondary | ICD-10-CM

## 2020-08-15 NOTE — ED Notes (Signed)
Suture removal complete.  Site covered with nonadherent dressing and tape.  No drainage or signs of infection noted.  Pt provided with RTC precautions including ongoing or increase in pain, drainage, redness and/or streaking.  Pt verbalizes understanding and denies further questions.

## 2020-08-15 NOTE — ED Triage Notes (Signed)
Nurse visit for suture removal.  Pt states he was supposed to RTC two days ago but had transportation issues.  Denies any s/s of infection including streaking, oozing, tenderness.  Denies pain.

## 2021-05-01 IMAGING — CR CHEST - 2 VIEW
2 series · 2 of 2 positions shown · non-contrast
Comparison: 02/02/2012

CLINICAL DATA: 38-year-old male with acute LEFT chest pain.

EXAM:
CHEST - 2 VIEW

[chest pa]
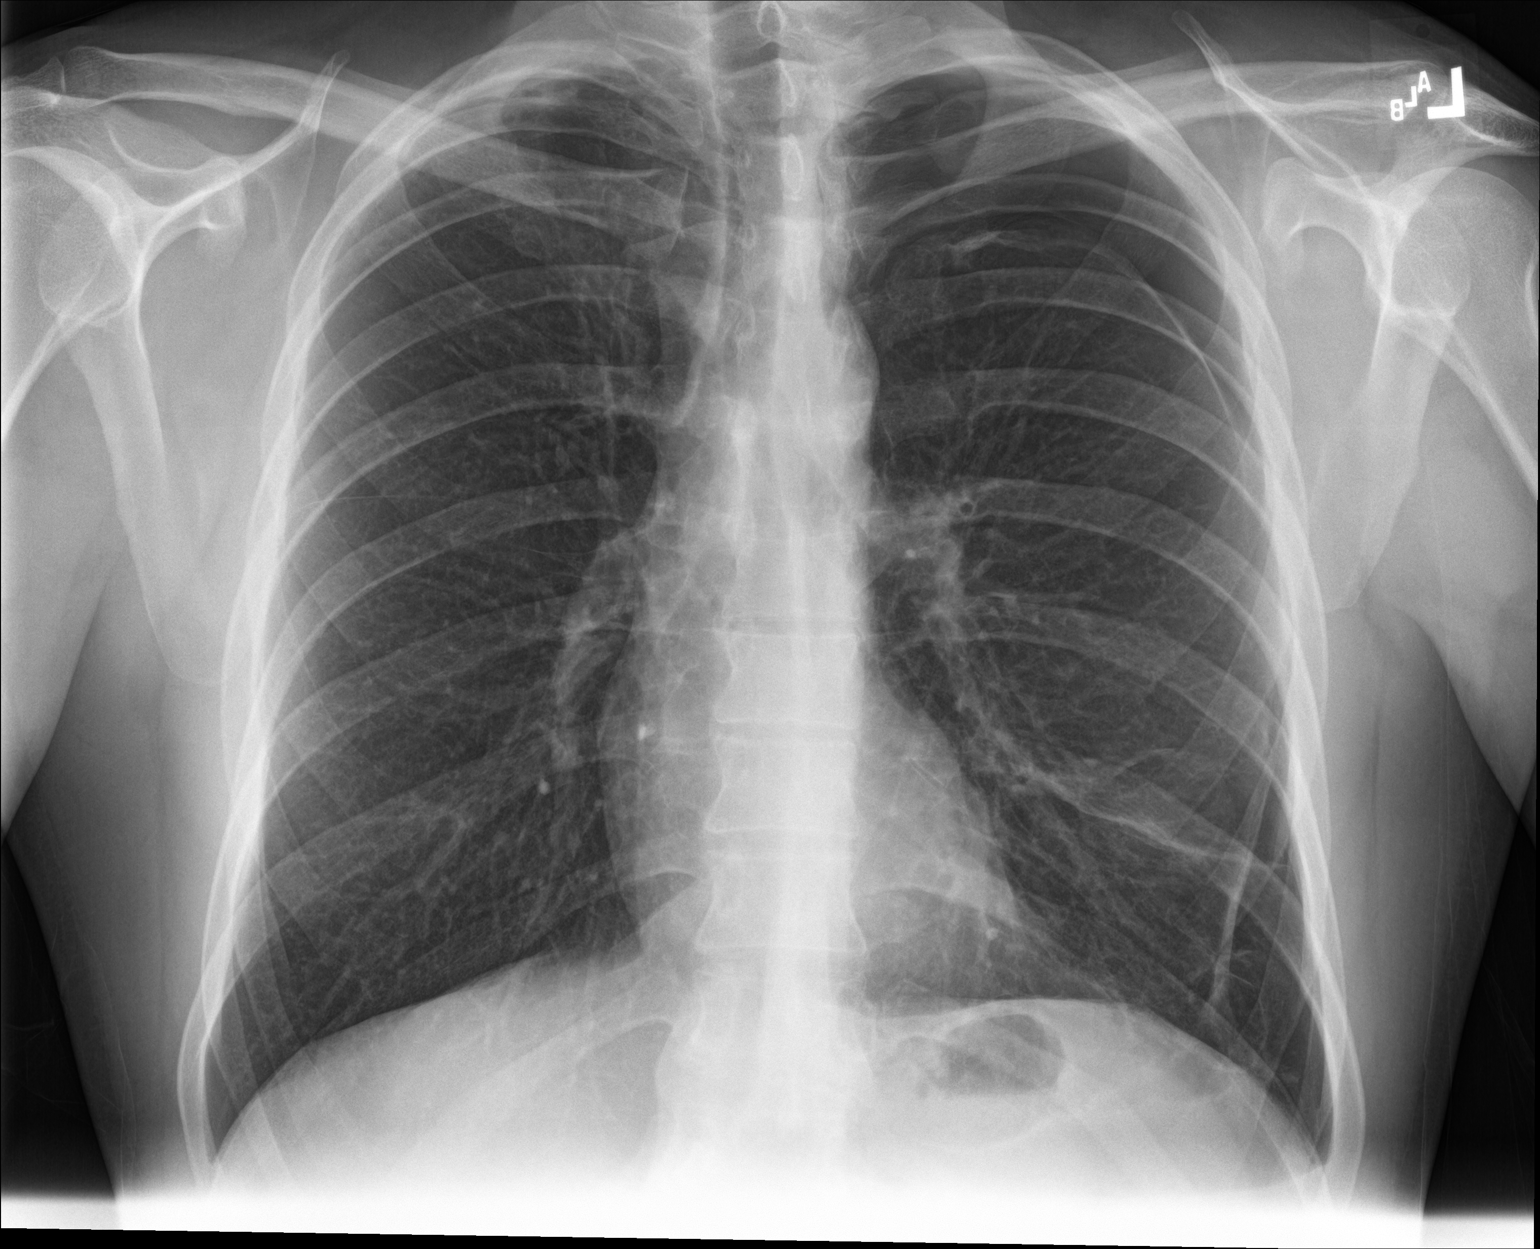

[chest lat]
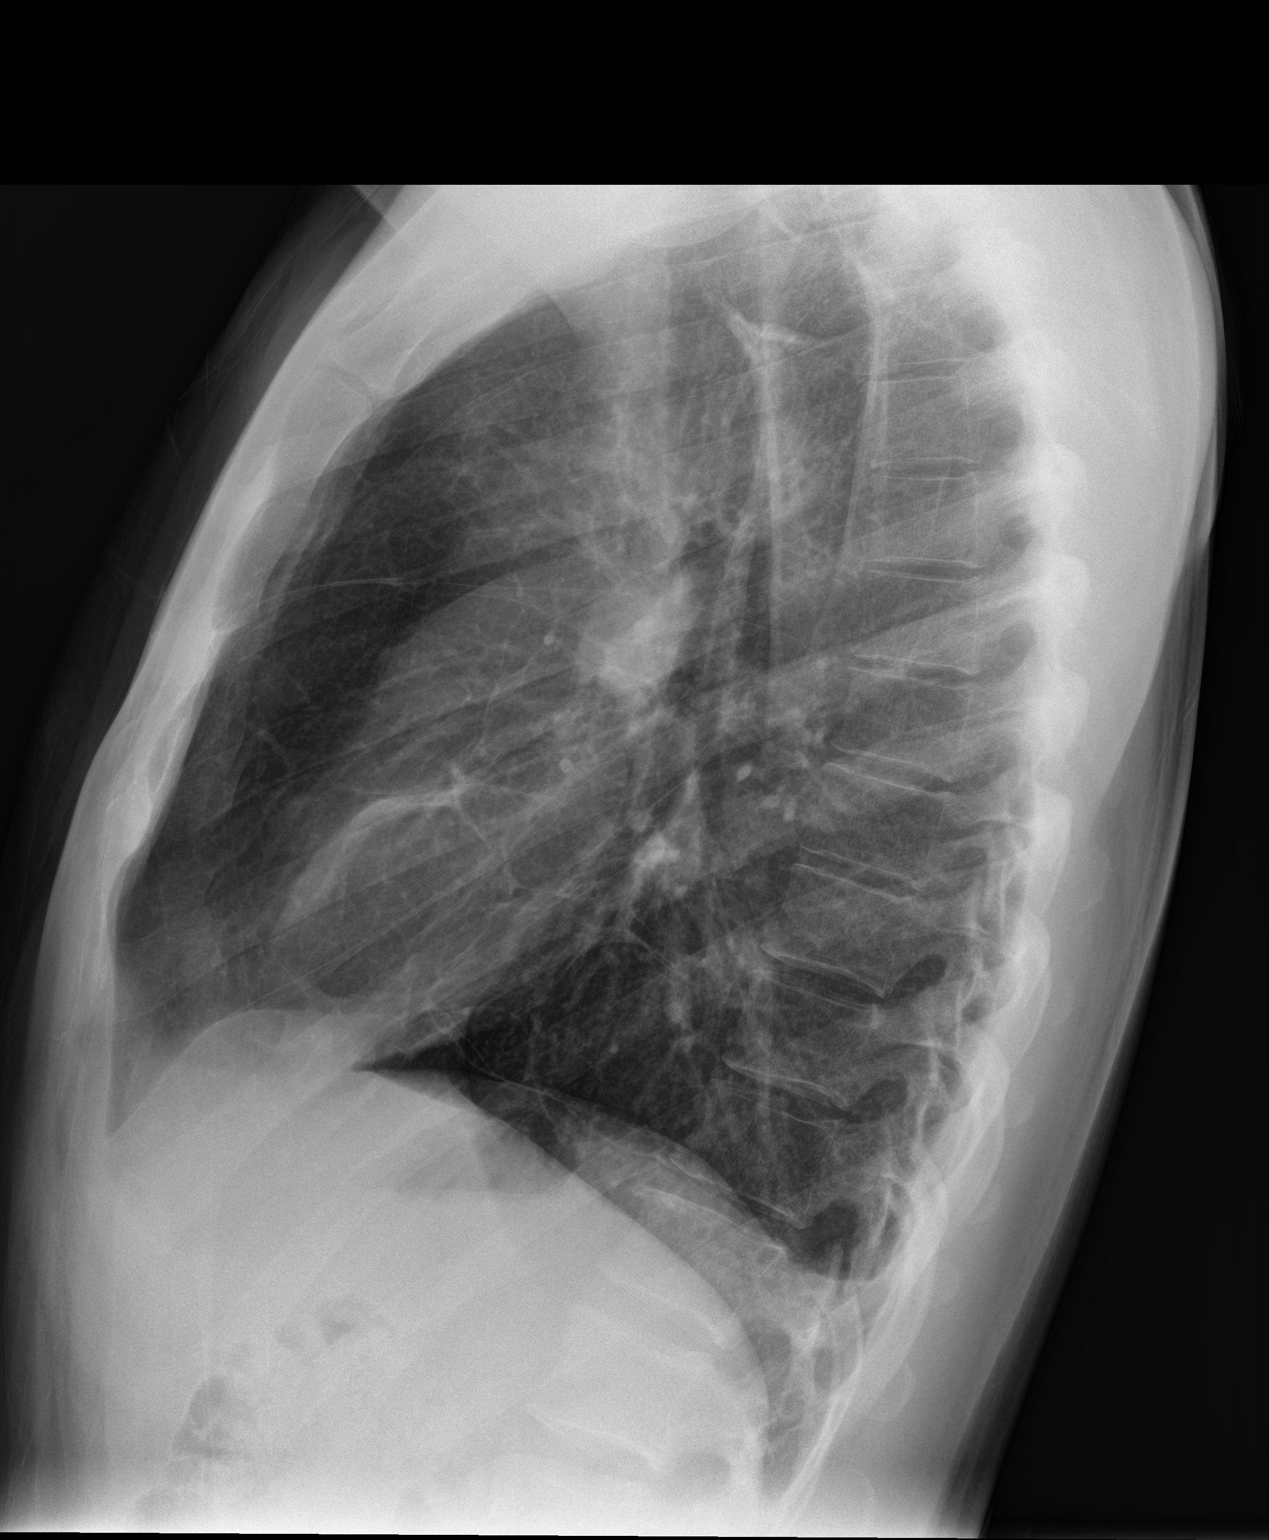

[2 of 2 positions shown; findings below may reference images not displayed]

FINDINGS: A moderate LEFT pneumothorax is noted with apical and basilar
components.

There is no evidence of mediastinal shift.

No airspace disease, consolidation, mass or RIGHT pneumothorax
noted.

There may be a trace LEFT pleural effusion present.

No acute bony abnormalities are identified.
IMPRESSION: Moderate LEFT pneumothorax without mediastinal shift.

Critical Value/emergent results were called by telephone at the time
of interpretation on 12/18/2018 at [DATE] to Dr. REISHIRO SAMUKA ,
who verbally acknowledged these results.

## 2021-05-03 IMAGING — CR CHEST - 2 VIEW
2 series · 2 of 2 positions shown · non-contrast
Comparison: 12/19/2018

CLINICAL DATA: Chest tube, postop, spontaneous pneumothorax

EXAM:
CHEST - 2 VIEW

[chest pa]
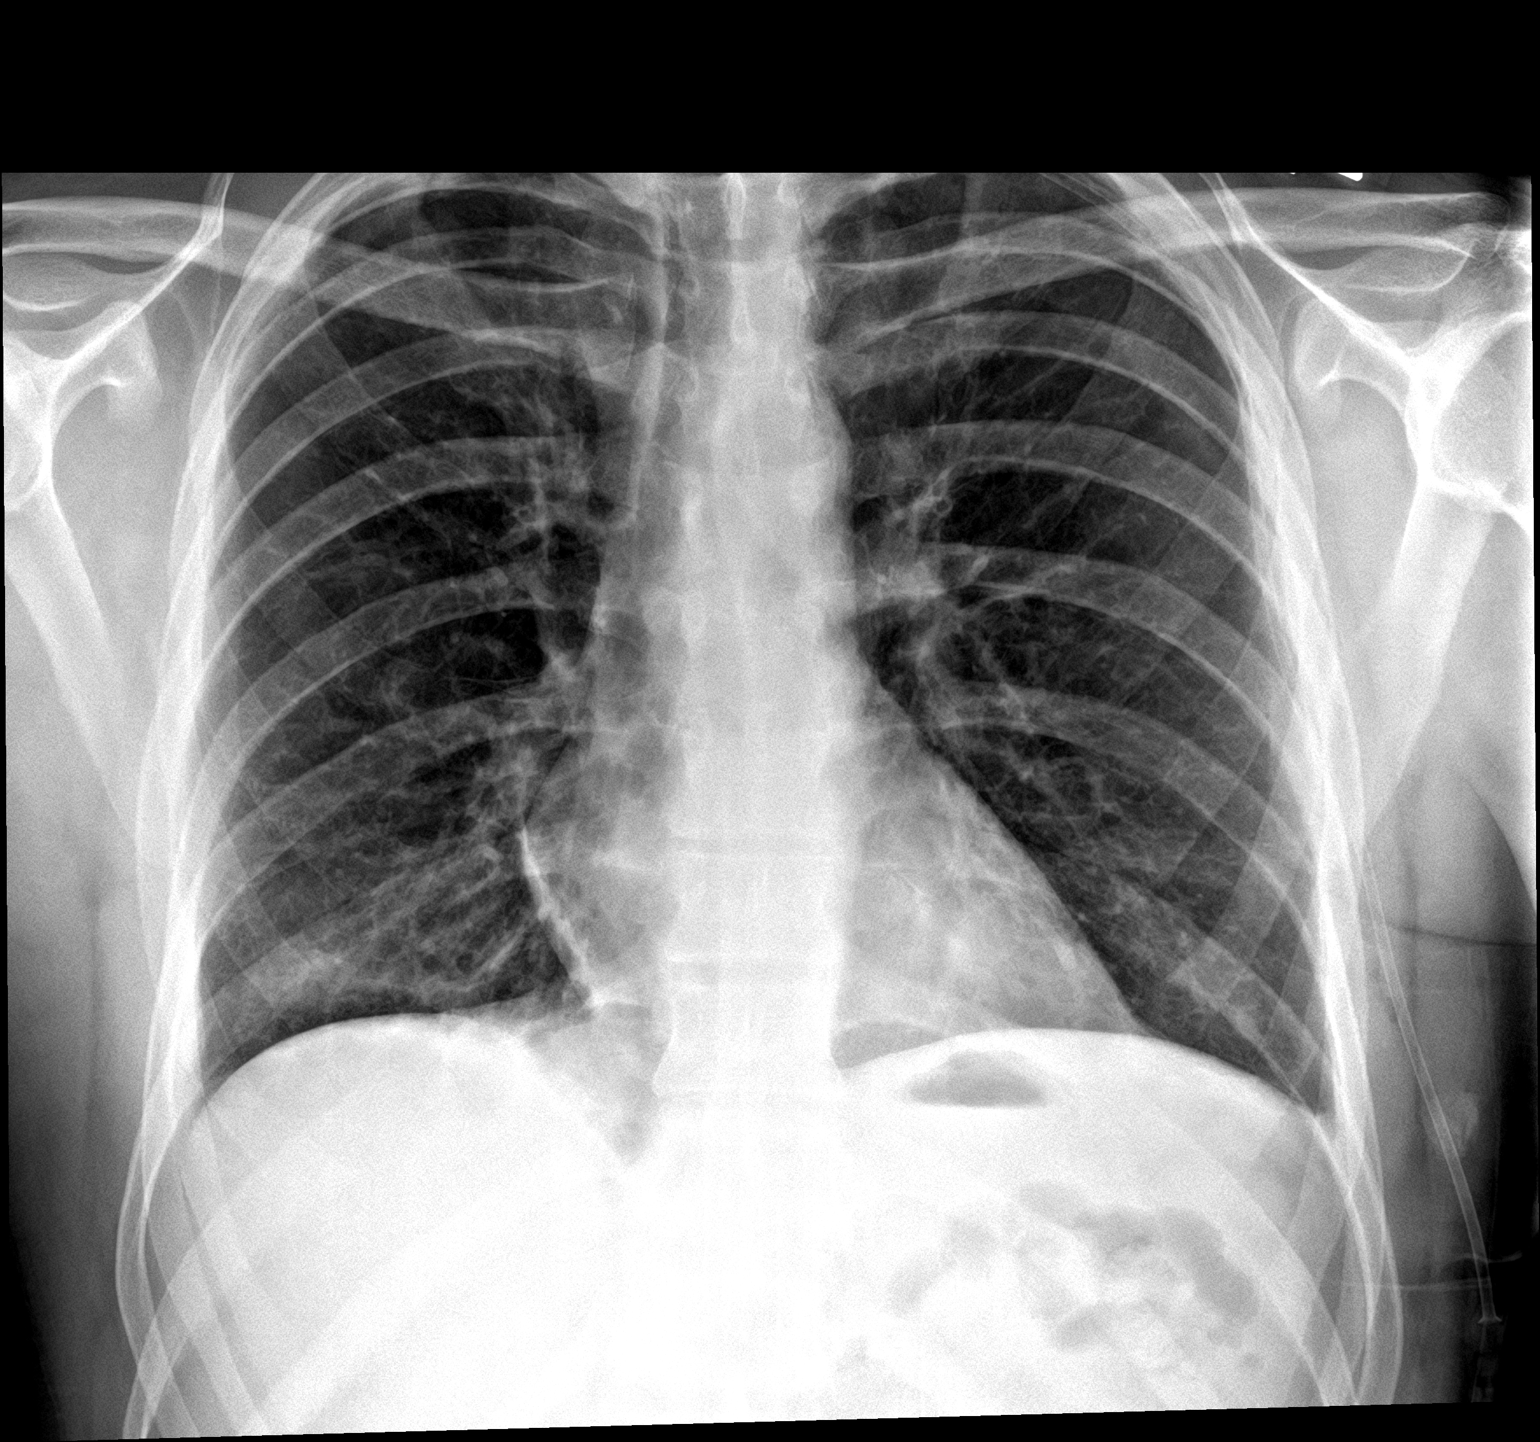

[chest lat]
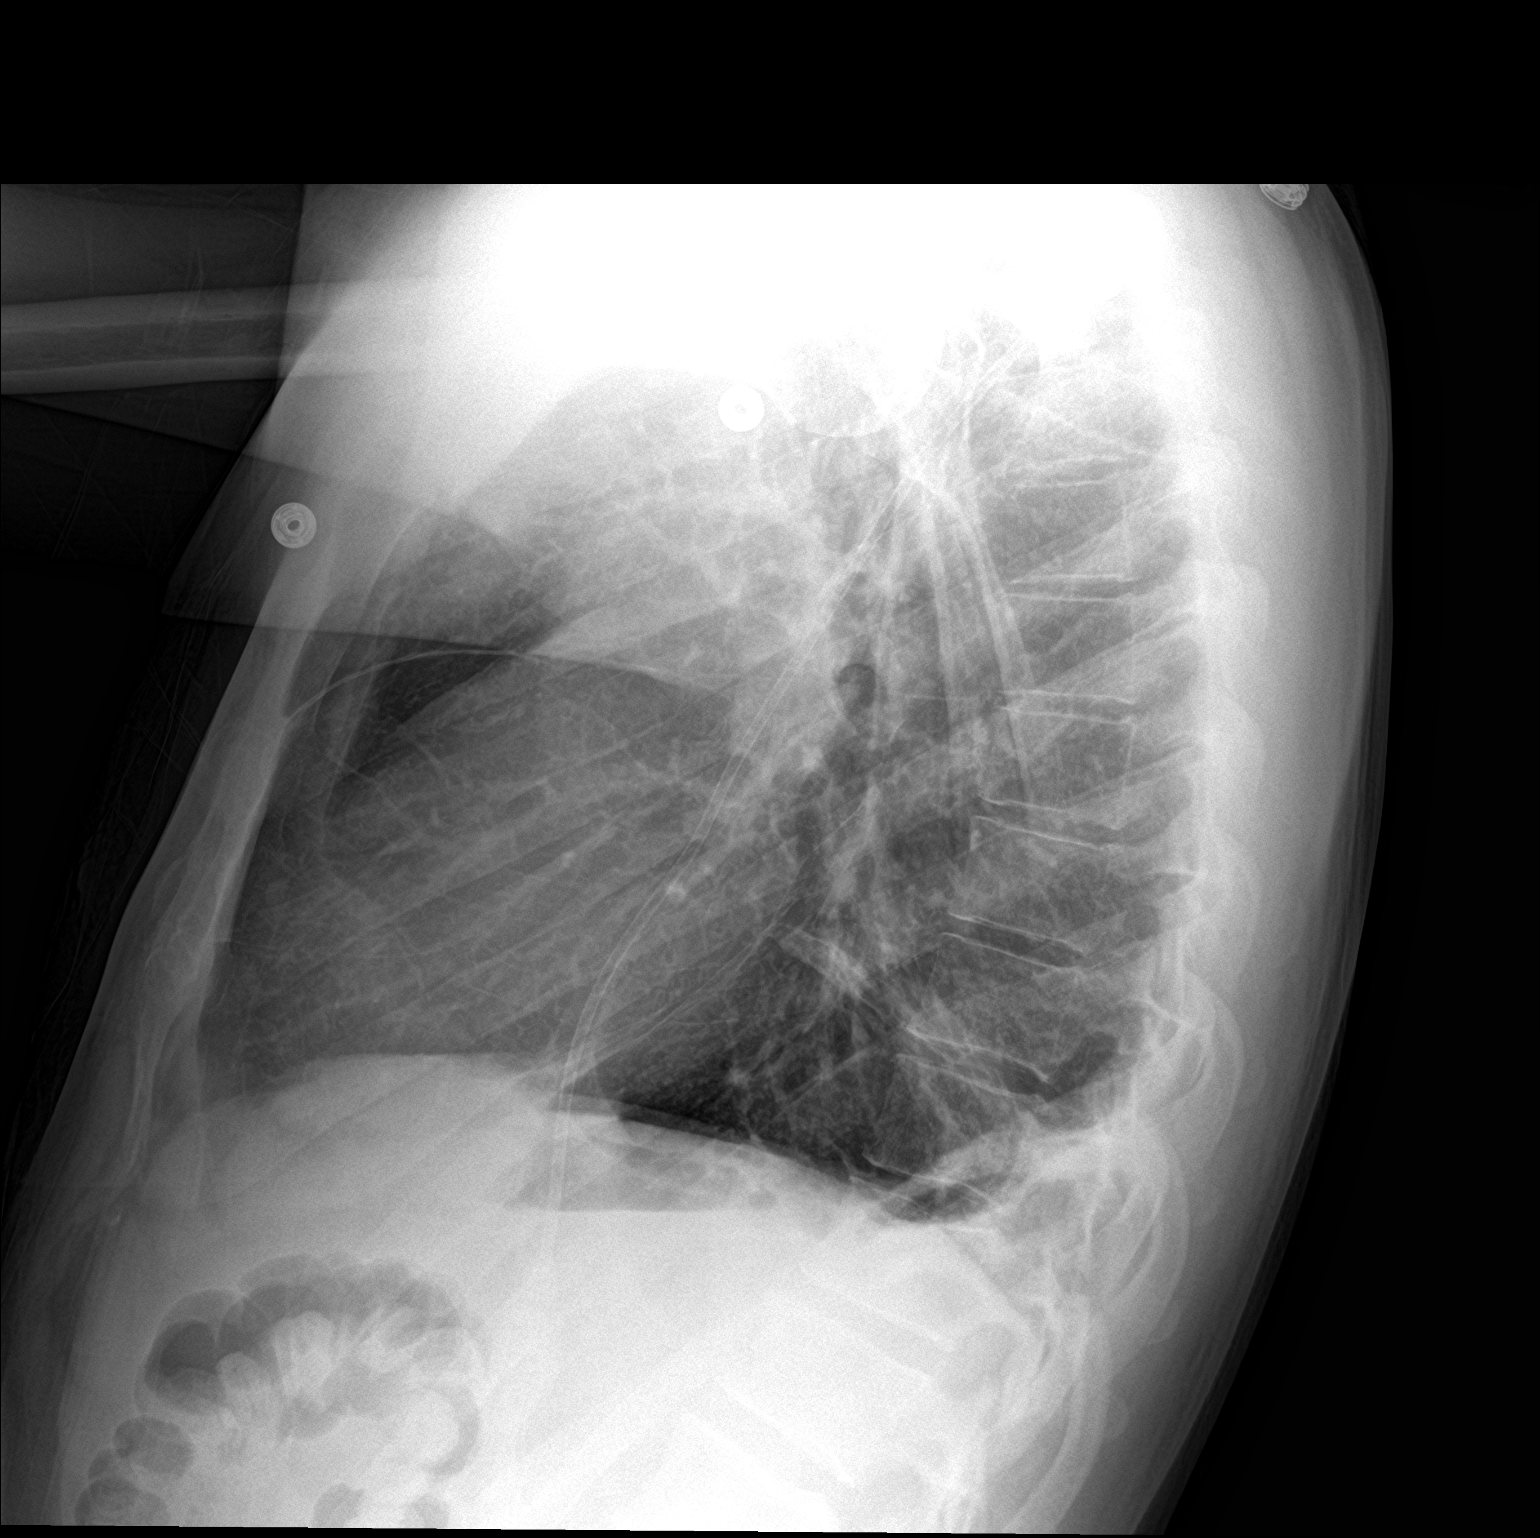

[2 of 2 positions shown; findings below may reference images not displayed]

FINDINGS: Small bore LEFT thoracostomy tube again identified.

Normal heart size, mediastinal contours, and pulmonary vascularity.

Central peribronchial thickening.

Minimal RIGHT basilar atelectasis.

No acute infiltrate.

Tiny residual LEFT apex pneumothorax.

Blunting of the posterior RIGHT costophrenic angle.

No acute osseous findings.
IMPRESSION: Tiny residual LEFT apex pneumothorax despite thoracostomy tube.

RIGHT basilar atelectasis and minimal RIGHT pleural effusion.

## 2023-05-20 ENCOUNTER — Ambulatory Visit (INDEPENDENT_AMBULATORY_CARE_PROVIDER_SITE_OTHER): Payer: Medicaid Other

## 2023-05-20 ENCOUNTER — Ambulatory Visit
Admission: EM | Admit: 2023-05-20 | Discharge: 2023-05-20 | Disposition: A | Discharge status: Home / Self Care | Payer: Medicaid Other | Attending: Emergency Medicine | Admitting: Emergency Medicine

## 2023-05-20 DIAGNOSIS — J069 Acute upper respiratory infection, unspecified: Secondary | ICD-10-CM | POA: Diagnosis not present

## 2023-05-20 DIAGNOSIS — R051 Acute cough: Secondary | ICD-10-CM

## 2023-05-20 MED ORDER — FLUTICASONE PROPIONATE 50 MCG/ACT NA SUSP: 2.0000 | Freq: Every day | NASAL | 0 refills | Status: AC

## 2023-05-20 MED ORDER — PROMETHAZINE-DM 6.25-15 MG/5ML PO SYRP: 5.0000 mL | ORAL_SOLUTION | Freq: Four times a day (QID) | ORAL | 0 refills | Status: AC | PRN

## 2023-05-20 MED ORDER — NAPROXEN 500 MG PO TABS: 500.0000 mg | ORAL_TABLET | Freq: Two times a day (BID) | ORAL | 0 refills | Status: AC

## 2023-05-20 NOTE — Discharge Instructions (Signed)
Your x-ray was negative for a collapsed lung or pneumonia.  Saline nasal irrigation with a NeilMed sinus rinse and distilled water as often as you want, Mucinex D, Naprosyn imbibed with 1000 mg of Tylenol twice a day.  May take an additional 1000 mg Tylenol 1 more time per day.  Flonase, Promethazine DM.  Get worse or if not better in 5 days, return here and we can reevaluate you.  Here is a list of primary care providers who are taking new patients:  [facility] [place] Dr. Joseph Berkshire (sports medicine) Dr. Elizabeth Sauer [address] [place] [address] [phone]  [facility] [address] [place], [place] [address] [phone]  [facility] [place] [address]  [place] [address]  [phone]  [facility] [address] [place], [place] [address] [phone]  [facility] [address] [place]  [phone] [place], [place] [address]  Here are clinics/ other resources who will see you if you do not have insurance. Some have certain criteria that you must meet. Call them and find out what they are:  [facility]: [address] [place], [place] [address] Phone: [phone] Hours: First and Third Saturdays of each Month, 9 a.m. - 1 p.m.  [facility]: [address], [address] [place], [place] [address] Phone: [phone] Hours: Tuesday, 4 p.m. - 8 p.m. Thursday, 1 p.m. - 8 p.m. Wednesday, 9 a.m. - [facility] [address] [place], [place] [address] Phone: [phone] Pharmacy Phone Number: [phone] Dental Phone Number: [phone] [facility] Insurance Help: [phone]  Dental Hours: Monday - Thursday, 8 a.m. - 6 p.m.  [facility] [address] [place], [place] [address] Phone: [phone] Pharmacy Phone Number: [phone] [facility] Insurance Help: [phone]  [facility] [address] [place] [place], [place]  [address] Phone: [phone] Pharmacy Phone Number: [phone] [facility] Insurance Help: [phone]  [facility] [address] [place], [place] [address] Phone: [phone] [facility] Insurance Help: [phone]   [facility] [address] [place], [place] [address] Phone: [phone]  Go to www.goodrx.com  or www.costplusdrugs.com to look up your medications. This will give you a list of where you can find your prescriptions at the most affordable prices. Or ask the pharmacist what the cash price is, or if they have any other discount programs available to help make your medication more affordable. This can be less expensive than what you would pay with insurance.

## 2023-05-20 NOTE — ED Provider Notes (Signed)
HPI  SUBJECTIVE:  Joe Guerra is a 42 y.o. male who presents with 6 days of nasal congestion, rhinorrhea, fatigue, body aches, headaches, sinus pain and pressure, postnasal drip, cough productive of green sputum.  Some nausea and mild diarrhea.  He reports a 30-minute episode of bilateral sharp, intermittent upper chest pain starting yesterday present primarily with deep inspiration and coughing.  There is no exertional positional component to this.  He has had chest pain like this before with his left spontaneous pneumothorax.  Denies fevers, facial swelling, upper dental pain, wheezing, shortness of breath, dyspnea on exertion, mopped assist, vomiting, abdominal pain.  He has multiple sick coworkers with an unknown viral illness.  No known COVID or flu exposure.  No antibiotics in the past 3 months.  No antipyretic in the past 6 hours.  He is sleeping well at night with Alka-Seltzer.  He is also taking vitamin C, vitamin D and zinc.  No aggravating factors.  He has a past medical history of left-sided pneumothorax that was treated with a chest tube.  He is not a smoker.  He has no other medical problems.  PCP: None.    Past Medical History:  Diagnosis Date   Cluster headaches    Migraine     Past Surgical History:  Procedure Laterality Date   WISDOM TOOTH EXTRACTION      Family History  Problem Relation Age of Onset   Heart disease Father    Thyroid cancer Mother    Prostate cancer Paternal Grandfather    Bladder Cancer Neg Hx    Kidney cancer Neg Hx     Social History   Tobacco Use   Smoking status: Every Day    Current packs/day: 0.50    Types: Cigarettes   Smokeless tobacco: Never  Vaping Use   Vaping status: Never Used  Substance Use Topics   Alcohol use: No   Drug use: No    No current facility-administered medications for this encounter.  Current Outpatient Medications:    fluticasone (FLONASE) 50 MCG/ACT nasal spray, Place 2 sprays into both nostrils  daily., Disp: 16 g, Rfl: 0   Magnesium 500 MG TABS, Take 500 mg by mouth at bedtime., Disp: , Rfl:    naproxen (NAPROSYN) 500 MG tablet, Take 1 tablet (500 mg total) by mouth 2 (two) times daily., Disp: 20 tablet, Rfl: 0   nortriptyline (PAMELOR) 10 MG capsule, Take 10 mg by mouth at bedtime. , Disp: , Rfl:    promethazine-dextromethorphan (PROMETHAZINE-DM) 6.25-15 MG/5ML syrup, Take 5 mLs by mouth 4 (four) times daily as needed for cough., Disp: 118 mL, Rfl: 0   tiZANidine (ZANAFLEX) 4 MG tablet, Take 1 tablet (4 mg total) by mouth every 8 (eight) hours as needed for muscle spasms., Disp: 30 tablet, Rfl: 0   vitamin C (ASCORBIC ACID) 250 MG tablet, Take 250 mg by mouth daily., Disp: , Rfl:    SUMAtriptan (IMITREX) 50 MG tablet, Take 1 tablet (50 mg total) by mouth once as needed for headache (cluster headache). May repeat in 2 hours if headache persists or recurs., Disp: 12 tablet, Rfl: 1  Allergies  Allergen Reactions   Influenza Vaccines Other (See Comments)    "sick"     ROS  As noted in HPI.   Physical Exam  BP 128/80 (BP Location: Left Arm)   Pulse 63   Temp 98.1 F (36.7 C) (Oral)   Ht 6' (1.829 m)   Wt 77.1 kg   SpO2  100%   BMI 23.06 kg/m   Constitutional: Well developed, well nourished, no acute distress Eyes:  EOMI, conjunctiva normal bilaterally HENT: Normocephalic, atraumatic,mucus membranes moist.  Positive purulent nasal congestion.  Normal turbinates.  No maxillary, frontal sinus tenderness.  Normal oropharynx.  No postnasal drip. Neck: No cervical lymphadenopathy Respiratory: Normal inspiratory effort.  Rhonchi right lower lobe.  Good air movement bilaterally.  Left-sided anterior and lateral chest wall tenderness. Cardiovascular: Normal rate, regular rhythm, no murmurs rubs or gallops GI: nondistended skin: No rash, skin intact Musculoskeletal: no deformities Neurologic: Alert & oriented x 3, no focal neuro deficits Psychiatric: Speech and behavior  appropriate   ED Course   Medications - No data to display  Orders Placed This Encounter  Procedures   DG Chest 2 View    Standing Status:   Standing    Number of Occurrences:   1    Reason for Exam (SYMPTOM  OR DIAGNOSIS REQUIRED):   Cough, bilateral upper chest pain.  History of left spontaneous pneumothorax.  Rule out pneumonia, pneumothorax, pleural effusion    No results found for this or any previous visit (from the past 24 hours). DG Chest 2 View Result Date: 05/20/2023 CLINICAL DATA:  Cough. Chest pain. Previous spontaneous pneumothorax. EXAM: CHEST - 2 VIEW COMPARISON:  01/30/2019 FINDINGS: The heart size and mediastinal contours are within normal limits. Both lungs are clear. No evidence of pneumothorax or pleural effusion. The visualized skeletal structures are unremarkable. IMPRESSION: No pneumothorax.  No active cardiopulmonary disease. Electronically Signed   By: Danae Orleans M.D.   On: 05/20/2023 18:46    ED Clinical Impression  1. Upper respiratory tract infection, unspecified type   2. Acute cough      ED Assessment/Plan     Patient presents with an upper respiratory infection.  Given his history of spontaneous pneumothorax, will check a chest x-ray to rule this out and to evaluate for pneumonia.  Home with saline nasal irrigation, Mucinex D, Naprosyn/Tylenol, Flonase, Promethazine DM.  Will provide primary care list for routine care.  He will return here in 4 to 5 days for double sickening or if he does not improve, and we can reevaluate him and the need for antibiotics at that time.  Deferring testing for COVID and flu as it would not change management.  He has no evidence of bacterial sinusitis at this time.  Reviewed imaging independently.  No pneumothorax, no infiltrate, acute cardiopulmonary disease.  See radiology report for full details.  X-ray negative for pneumonia.  No spontaneous pneumothorax.  Supportive treatment.  Plan as above.  Discussed  imaging, MDM, treatment plan, and plan for follow-up with patient. Discussed sn/sx that should prompt return to the ED. patient agrees with plan.   Meds ordered this encounter  Medications   fluticasone (FLONASE) 50 MCG/ACT nasal spray    Sig: Place 2 sprays into both nostrils daily.    Dispense:  16 g    Refill:  0   promethazine-dextromethorphan (PROMETHAZINE-DM) 6.25-15 MG/5ML syrup    Sig: Take 5 mLs by mouth 4 (four) times daily as needed for cough.    Dispense:  118 mL    Refill:  0   naproxen (NAPROSYN) 500 MG tablet    Sig: Take 1 tablet (500 mg total) by mouth 2 (two) times daily.    Dispense:  20 tablet    Refill:  0      *This clinic note was created using Scientist, clinical (histocompatibility and immunogenetics). Therefore, there may  be occasional mistakes despite careful proofreading.  ?    Domenick Gong, MD 05/21/23 1729

## 2023-05-20 NOTE — ED Triage Notes (Signed)
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
